# Patient Record
Sex: Female | Born: 1970 | Race: Black or African American | Hispanic: No | Marital: Single | Smoking: Former smoker
Health system: Southern US, Community
[De-identification: ages and names within clinical notes are randomized; demographics above are authoritative.]

## PROBLEM LIST (undated history)

## (undated) ENCOUNTER — Emergency Department (HOSPITAL_COMMUNITY): Admission: EM | Payer: No Typology Code available for payment source | Source: Home / Self Care

## (undated) DIAGNOSIS — F419 Anxiety disorder, unspecified: Secondary | ICD-10-CM

## (undated) DIAGNOSIS — I10 Essential (primary) hypertension: Secondary | ICD-10-CM

## (undated) DIAGNOSIS — E119 Type 2 diabetes mellitus without complications: Secondary | ICD-10-CM

## (undated) DIAGNOSIS — K219 Gastro-esophageal reflux disease without esophagitis: Secondary | ICD-10-CM

## (undated) DIAGNOSIS — J45909 Unspecified asthma, uncomplicated: Secondary | ICD-10-CM

## (undated) DIAGNOSIS — R51 Headache: Principal | ICD-10-CM

## (undated) HISTORY — DX: Gastro-esophageal reflux disease without esophagitis: K21.9

## (undated) HISTORY — DX: Unspecified asthma, uncomplicated: J45.909

## (undated) HISTORY — PX: JOINT REPLACEMENT: SHX530

## (undated) HISTORY — DX: Headache: R51

---

## 1997-04-21 ENCOUNTER — Inpatient Hospital Stay (HOSPITAL_COMMUNITY): Admission: AD | Admit: 1997-04-21 | Discharge: 1997-04-21 | Payer: Self-pay | Admitting: Obstetrics

## 1997-05-04 ENCOUNTER — Encounter: Admission: RE | Admit: 1997-05-04 | Discharge: 1997-08-02 | Payer: Self-pay | Admitting: Obstetrics

## 1997-06-29 ENCOUNTER — Ambulatory Visit (HOSPITAL_COMMUNITY): Admission: RE | Admit: 1997-06-29 | Discharge: 1997-06-29 | Payer: Self-pay | Admitting: Obstetrics & Gynecology

## 1997-08-03 ENCOUNTER — Encounter: Admission: RE | Admit: 1997-08-03 | Discharge: 1997-11-01 | Payer: Self-pay | Admitting: Obstetrics

## 1997-08-31 ENCOUNTER — Inpatient Hospital Stay (HOSPITAL_COMMUNITY): Admission: AD | Admit: 1997-08-31 | Discharge: 1997-08-31 | Payer: Self-pay | Admitting: *Deleted

## 1997-09-19 ENCOUNTER — Inpatient Hospital Stay (HOSPITAL_COMMUNITY): Admission: AD | Admit: 1997-09-19 | Discharge: 1997-09-19 | Payer: Self-pay | Admitting: *Deleted

## 1997-09-20 ENCOUNTER — Inpatient Hospital Stay (HOSPITAL_COMMUNITY): Admission: RE | Admit: 1997-09-20 | Discharge: 1997-09-20 | Payer: Self-pay | Admitting: *Deleted

## 1997-09-27 ENCOUNTER — Ambulatory Visit (HOSPITAL_COMMUNITY): Admission: RE | Admit: 1997-09-27 | Discharge: 1997-09-27 | Payer: Self-pay | Admitting: Obstetrics

## 1997-10-19 ENCOUNTER — Inpatient Hospital Stay (HOSPITAL_COMMUNITY): Admission: RE | Admit: 1997-10-19 | Discharge: 1997-10-19 | Payer: Self-pay | Admitting: *Deleted

## 1997-10-26 ENCOUNTER — Encounter: Admission: RE | Admit: 1997-10-26 | Discharge: 1997-10-26 | Payer: Self-pay | Admitting: Obstetrics

## 1997-10-26 ENCOUNTER — Inpatient Hospital Stay (HOSPITAL_COMMUNITY): Admission: AD | Admit: 1997-10-26 | Discharge: 1997-10-26 | Payer: Self-pay | Admitting: *Deleted

## 1997-11-02 ENCOUNTER — Encounter: Admission: RE | Admit: 1997-11-02 | Discharge: 1997-11-02 | Payer: Self-pay | Admitting: Obstetrics

## 1997-11-03 ENCOUNTER — Ambulatory Visit (HOSPITAL_COMMUNITY): Admission: AD | Admit: 1997-11-03 | Discharge: 1997-11-03 | Payer: Self-pay | Admitting: Obstetrics

## 1997-11-09 ENCOUNTER — Encounter: Admission: RE | Admit: 1997-11-09 | Discharge: 1997-11-09 | Payer: Self-pay | Admitting: Obstetrics

## 1997-11-16 ENCOUNTER — Encounter: Admission: RE | Admit: 1997-11-16 | Discharge: 1997-11-16 | Payer: Self-pay | Admitting: Obstetrics

## 1997-11-17 ENCOUNTER — Ambulatory Visit (HOSPITAL_COMMUNITY): Admission: RE | Admit: 1997-11-17 | Discharge: 1997-11-17 | Payer: Self-pay | Admitting: *Deleted

## 1997-11-23 ENCOUNTER — Encounter: Admission: RE | Admit: 1997-11-23 | Discharge: 1997-11-23 | Payer: Self-pay | Admitting: Obstetrics

## 1997-11-27 ENCOUNTER — Encounter (HOSPITAL_COMMUNITY): Admission: RE | Admit: 1997-11-27 | Discharge: 1997-12-01 | Payer: Self-pay | Admitting: *Deleted

## 1997-11-29 ENCOUNTER — Encounter: Admission: RE | Admit: 1997-11-29 | Discharge: 1997-11-29 | Payer: Self-pay | Admitting: Obstetrics & Gynecology

## 1997-11-29 ENCOUNTER — Inpatient Hospital Stay (HOSPITAL_COMMUNITY): Admission: AD | Admit: 1997-11-29 | Discharge: 1997-12-03 | Payer: Self-pay | Admitting: Obstetrics

## 1997-12-08 ENCOUNTER — Inpatient Hospital Stay (HOSPITAL_COMMUNITY): Admission: AD | Admit: 1997-12-08 | Discharge: 1997-12-08 | Payer: Self-pay | Admitting: Obstetrics

## 1997-12-11 ENCOUNTER — Inpatient Hospital Stay (HOSPITAL_COMMUNITY): Admission: AD | Admit: 1997-12-11 | Discharge: 1997-12-11 | Payer: Self-pay | Admitting: Obstetrics & Gynecology

## 1999-08-25 ENCOUNTER — Emergency Department (HOSPITAL_COMMUNITY): Admission: EM | Admit: 1999-08-25 | Discharge: 1999-08-25 | Payer: Self-pay | Admitting: Emergency Medicine

## 1999-08-25 ENCOUNTER — Encounter: Payer: Self-pay | Admitting: Emergency Medicine

## 1999-10-01 ENCOUNTER — Encounter: Admission: RE | Admit: 1999-10-01 | Discharge: 1999-10-24 | Payer: Self-pay | Admitting: Orthopedic Surgery

## 1999-10-24 ENCOUNTER — Encounter: Admission: RE | Admit: 1999-10-24 | Discharge: 1999-12-19 | Payer: Self-pay | Admitting: Internal Medicine

## 1999-12-19 ENCOUNTER — Encounter: Admission: RE | Admit: 1999-12-19 | Discharge: 2000-03-18 | Payer: Self-pay | Admitting: Sports Medicine

## 2000-01-25 ENCOUNTER — Encounter: Payer: Self-pay | Admitting: Sports Medicine

## 2000-01-25 ENCOUNTER — Ambulatory Visit (HOSPITAL_COMMUNITY): Admission: RE | Admit: 2000-01-25 | Discharge: 2000-01-25 | Payer: Self-pay | Admitting: Sports Medicine

## 2000-04-14 ENCOUNTER — Encounter: Admission: RE | Admit: 2000-04-14 | Discharge: 2000-05-14 | Payer: Self-pay | Admitting: Internal Medicine

## 2000-06-01 ENCOUNTER — Encounter: Admission: RE | Admit: 2000-06-01 | Discharge: 2000-07-06 | Payer: Self-pay | Admitting: Internal Medicine

## 2003-02-04 ENCOUNTER — Emergency Department (HOSPITAL_COMMUNITY): Admission: EM | Admit: 2003-02-04 | Discharge: 2003-02-04 | Payer: Self-pay | Admitting: Emergency Medicine

## 2003-12-02 ENCOUNTER — Emergency Department (HOSPITAL_COMMUNITY): Admission: EM | Admit: 2003-12-02 | Discharge: 2003-12-03 | Payer: Self-pay | Admitting: Emergency Medicine

## 2004-11-05 ENCOUNTER — Ambulatory Visit: Payer: Self-pay | Admitting: Family Medicine

## 2004-11-26 ENCOUNTER — Ambulatory Visit: Payer: Self-pay | Admitting: Family Medicine

## 2004-12-09 ENCOUNTER — Ambulatory Visit: Payer: Self-pay | Admitting: *Deleted

## 2005-01-17 ENCOUNTER — Ambulatory Visit: Payer: Self-pay | Admitting: Family Medicine

## 2005-03-19 ENCOUNTER — Ambulatory Visit: Payer: Self-pay | Admitting: Family Medicine

## 2005-03-24 ENCOUNTER — Ambulatory Visit: Payer: Self-pay | Admitting: Family Medicine

## 2005-03-27 ENCOUNTER — Ambulatory Visit: Payer: Self-pay | Admitting: Family Medicine

## 2005-04-17 ENCOUNTER — Ambulatory Visit: Payer: Self-pay | Admitting: Family Medicine

## 2005-07-15 ENCOUNTER — Ambulatory Visit: Payer: Self-pay | Admitting: Family Medicine

## 2005-08-25 ENCOUNTER — Ambulatory Visit: Payer: Self-pay | Admitting: Family Medicine

## 2005-09-18 ENCOUNTER — Ambulatory Visit: Payer: Self-pay | Admitting: Family Medicine

## 2005-11-05 ENCOUNTER — Ambulatory Visit: Payer: Self-pay | Admitting: Family Medicine

## 2006-10-07 ENCOUNTER — Encounter (INDEPENDENT_AMBULATORY_CARE_PROVIDER_SITE_OTHER): Payer: Self-pay | Admitting: *Deleted

## 2006-12-08 ENCOUNTER — Emergency Department (HOSPITAL_COMMUNITY): Admission: EM | Admit: 2006-12-08 | Discharge: 2006-12-08 | Payer: Self-pay | Admitting: Emergency Medicine

## 2007-02-16 ENCOUNTER — Ambulatory Visit (HOSPITAL_COMMUNITY): Admission: RE | Admit: 2007-02-16 | Discharge: 2007-02-16 | Payer: Self-pay | Admitting: Chiropractic Medicine

## 2008-01-27 ENCOUNTER — Emergency Department (HOSPITAL_COMMUNITY): Admission: EM | Admit: 2008-01-27 | Discharge: 2008-01-27 | Payer: Self-pay | Admitting: Emergency Medicine

## 2008-02-10 ENCOUNTER — Inpatient Hospital Stay (HOSPITAL_COMMUNITY): Admission: AD | Admit: 2008-02-10 | Discharge: 2008-02-10 | Payer: Self-pay | Admitting: Obstetrics & Gynecology

## 2008-07-19 ENCOUNTER — Encounter: Admission: RE | Admit: 2008-07-19 | Discharge: 2008-07-19 | Payer: Self-pay | Admitting: Obstetrics and Gynecology

## 2008-07-27 ENCOUNTER — Emergency Department (HOSPITAL_COMMUNITY): Admission: EM | Admit: 2008-07-27 | Discharge: 2008-07-27 | Payer: Self-pay | Admitting: Family Medicine

## 2008-08-04 ENCOUNTER — Ambulatory Visit: Admission: RE | Admit: 2008-08-04 | Discharge: 2008-08-04 | Payer: Self-pay | Admitting: Obstetrics and Gynecology

## 2008-08-16 ENCOUNTER — Emergency Department (HOSPITAL_COMMUNITY): Admission: EM | Admit: 2008-08-16 | Discharge: 2008-08-16 | Payer: Self-pay | Admitting: Family Medicine

## 2008-08-28 ENCOUNTER — Emergency Department (HOSPITAL_COMMUNITY): Admission: EM | Admit: 2008-08-28 | Discharge: 2008-08-28 | Payer: Self-pay | Admitting: Emergency Medicine

## 2008-09-19 ENCOUNTER — Inpatient Hospital Stay (HOSPITAL_COMMUNITY): Admission: RE | Admit: 2008-09-19 | Discharge: 2008-09-22 | Payer: Self-pay | Admitting: Obstetrics and Gynecology

## 2008-11-16 ENCOUNTER — Emergency Department (HOSPITAL_COMMUNITY): Admission: EM | Admit: 2008-11-16 | Discharge: 2008-11-16 | Payer: Self-pay | Admitting: Emergency Medicine

## 2008-11-27 ENCOUNTER — Ambulatory Visit (HOSPITAL_COMMUNITY): Admission: RE | Admit: 2008-11-27 | Discharge: 2008-11-28 | Payer: Self-pay | Admitting: Orthopedic Surgery

## 2008-12-28 ENCOUNTER — Encounter: Admission: RE | Admit: 2008-12-28 | Discharge: 2009-01-18 | Payer: Self-pay | Admitting: Orthopedic Surgery

## 2009-01-22 ENCOUNTER — Encounter: Admission: RE | Admit: 2009-01-22 | Discharge: 2009-04-02 | Payer: Self-pay | Admitting: Orthopedic Surgery

## 2009-08-02 ENCOUNTER — Ambulatory Visit (HOSPITAL_BASED_OUTPATIENT_CLINIC_OR_DEPARTMENT_OTHER): Admission: RE | Admit: 2009-08-02 | Discharge: 2009-08-03 | Payer: Self-pay | Admitting: Unknown Physician Specialty

## 2009-08-09 ENCOUNTER — Encounter: Admission: RE | Admit: 2009-08-09 | Discharge: 2009-10-25 | Payer: Self-pay | Admitting: Orthopedic Surgery

## 2009-09-18 ENCOUNTER — Emergency Department (HOSPITAL_COMMUNITY): Admission: EM | Admit: 2009-09-18 | Discharge: 2009-09-18 | Payer: Self-pay | Admitting: Emergency Medicine

## 2009-11-08 ENCOUNTER — Ambulatory Visit (HOSPITAL_BASED_OUTPATIENT_CLINIC_OR_DEPARTMENT_OTHER): Admission: RE | Admit: 2009-11-08 | Discharge: 2009-11-08 | Payer: Self-pay | Admitting: Unknown Physician Specialty

## 2010-04-03 LAB — POCT HEMOGLOBIN-HEMACUE: Hemoglobin: 13.5 g/dL (ref 12.0–15.0)

## 2010-04-24 LAB — BASIC METABOLIC PANEL
CO2: 25 mEq/L (ref 19–32)
Chloride: 103 mEq/L (ref 96–112)
Glucose, Bld: 95 mg/dL (ref 70–99)
Potassium: 3.6 mEq/L (ref 3.5–5.1)
Sodium: 138 mEq/L (ref 135–145)

## 2010-04-24 LAB — CBC
RBC: 4.74 MIL/uL (ref 3.87–5.11)
WBC: 10.4 10*3/uL (ref 4.0–10.5)

## 2010-04-25 LAB — BASIC METABOLIC PANEL
BUN: 8 mg/dL (ref 6–23)
CO2: 22 mEq/L (ref 19–32)
Creatinine, Ser: 0.9 mg/dL (ref 0.4–1.2)
GFR calc Af Amer: >60 mL/min (ref >60)
GFR calc non Af Amer: >60 mL/min (ref >60)

## 2010-04-25 LAB — CBC
HCT: 41.6 % (ref 36.0–46.0)
Platelets: 218 10*3/uL (ref 150–400)
RBC: 4.97 MIL/uL (ref 3.87–5.11)
RDW: 14 % (ref 11.5–15.5)
WBC: 10.3 10*3/uL (ref 4.0–10.5)

## 2010-04-25 LAB — DIFFERENTIAL
Basophils Absolute: 0 10*3/uL (ref 0.0–0.1)
Basophils Relative: 0 % (ref 0–1)
Eosinophils Absolute: 0.1 10*3/uL (ref 0.0–0.7)
Monocytes Absolute: 0.6 10*3/uL (ref 0.1–1.0)
Monocytes Relative: 6 % (ref 3–12)

## 2010-04-26 LAB — GLUCOSE, CAPILLARY
Glucose-Capillary: 102 mg/dL — ABNORMAL HIGH (ref 70–99)
Glucose-Capillary: 106 mg/dL — ABNORMAL HIGH (ref 70–99)
Glucose-Capillary: 111 mg/dL — ABNORMAL HIGH (ref 70–99)
Glucose-Capillary: 86 mg/dL (ref 70–99)
Glucose-Capillary: 96 mg/dL (ref 70–99)

## 2010-04-26 LAB — CCBB MATERNAL DONOR DRAW

## 2010-04-26 LAB — RH IMMUNE GLOB WKUP(>/=20WKS)(NOT WOMEN'S HOSP): Fetal Screen: NEGATIVE

## 2010-04-26 LAB — CBC
HCT: 39.4 % (ref 36.0–46.0)
MCV: 86.8 fL (ref 78.0–100.0)
RDW: 14 % (ref 11.5–15.5)
WBC: 11.9 10*3/uL — ABNORMAL HIGH (ref 4.0–10.5)

## 2010-04-27 LAB — CBC
HCT: 39.7 % (ref 36.0–46.0)
MCHC: 32.5 g/dL (ref 30.0–36.0)
MCV: 86.1 fL (ref 78.0–100.0)
Platelets: 196 10*3/uL (ref 150–400)
RDW: 14.4 % (ref 11.5–15.5)
WBC: 10.4 10*3/uL (ref 4.0–10.5)

## 2010-04-27 LAB — GLUCOSE, CAPILLARY
Glucose-Capillary: 108 mg/dL — ABNORMAL HIGH (ref 70–99)
Glucose-Capillary: 112 mg/dL — ABNORMAL HIGH (ref 70–99)
Glucose-Capillary: 120 mg/dL — ABNORMAL HIGH (ref 70–99)
Glucose-Capillary: 122 mg/dL — ABNORMAL HIGH (ref 70–99)
Glucose-Capillary: 131 mg/dL — ABNORMAL HIGH (ref 70–99)
Glucose-Capillary: 158 mg/dL — ABNORMAL HIGH (ref 70–99)
Glucose-Capillary: 92 mg/dL (ref 70–99)

## 2010-04-27 LAB — RPR: RPR Ser Ql: NONREACTIVE

## 2010-05-06 LAB — URINE CULTURE

## 2010-05-06 LAB — POCT I-STAT, CHEM 8
BUN: 7 mg/dL (ref 6–23)
Chloride: 103 mEq/L (ref 96–112)
Creatinine, Ser: 0.9 mg/dL (ref 0.4–1.2)
Potassium: 4.1 mEq/L (ref 3.5–5.1)
Sodium: 137 mEq/L (ref 135–145)
TCO2: 24 mmol/L (ref 0–100)

## 2010-05-06 LAB — WET PREP, GENITAL: Yeast Wet Prep HPF POC: NONE SEEN

## 2010-05-06 LAB — URINALYSIS, ROUTINE W REFLEX MICROSCOPIC
Bilirubin Urine: NEGATIVE
Hgb urine dipstick: NEGATIVE
Ketones, ur: NEGATIVE mg/dL
Specific Gravity, Urine: 1.015 (ref 1.005–1.030)
Urobilinogen, UA: 0.2 mg/dL (ref 0.0–1.0)

## 2010-05-06 LAB — CBC
HCT: 39.3 % (ref 36.0–46.0)
MCV: 87.1 fL (ref 78.0–100.0)
Platelets: 245 10*3/uL (ref 150–400)
RDW: 13 % (ref 11.5–15.5)

## 2010-05-06 LAB — POCT PREGNANCY, URINE: Preg Test, Ur: POSITIVE

## 2010-05-06 LAB — GC/CHLAMYDIA PROBE AMP, GENITAL
Chlamydia, DNA Probe: NEGATIVE
GC Probe Amp, Genital: NEGATIVE

## 2010-05-06 LAB — URINE MICROSCOPIC-ADD ON

## 2010-06-04 NOTE — Op Note (Signed)
[name], Taylor [name]                 ACCOUNT NO.:  [id number]   MEDICAL RECORD NO.:  [id number]          PATIENT TYPE:  INP   LOCATION:  9146                          FACILITY:  [facility]   PHYSICIAN:  Malva Limes, M.D.    DATE OF BIRTH:  20-Jan-1971   DATE OF PROCEDURE:  09/19/2008  DATE OF DISCHARGE:                               OPERATIVE REPORT   PREOPERATIVE DIAGNOSES:  1. Intrauterine pregnancy at term.  2. The patient desires primary cesarean section.  3. Insulin-dependent gestational diabetes.  4. Advanced maternal age.   POSTOPERATIVE DIAGNOSES:  1. Intrauterine pregnancy at term.  2. The patient desires primary cesarean section.  3. Insulin-dependent gestational diabetes.  4. Advanced maternal age.   PROCEDURE:  Primary low-transverse cesarean section.   SURGEON:  Malva Limes, MD   ANESTHESIA:  Spinal.   ANTIBIOTIC:  Ancef 1 g.   DRAINS:  Foley bedside drainage.   ESTIMATED BLOOD LOSS:  900 mL.   COMPLICATIONS:  None.   SPECIMENS:  None.   PROCEDURE:  The patient was taken to the operating room, where a spinal  anesthetic was placed without difficulty.  She was then placed in the  dorsal supine position with the left lateral tilt.  She was prepped with  Betadine and draped in the usual fashion for this procedure.  A  Pfannenstiel incision was made 2 cm above the pubic symphysis.  On  entering the abdominal cavity, the bladder flap was taken down with  sharp dissection.  A low transverse uterine incision was made in the  midline and extended laterally with blunt dissection.  Amniotic fluid  was noted to be clear.  The vacuum extractor was used to deliver the  vertex.  On delivery of the head, the oropharynx and nostrils were bulb  suctioned.  A nuchal cord x1 was reduced.  The remaining infant was then  delivered.  The cord was doubly clamped and cut and the infant was  handed to the awaiting NICU team.  Placenta was manually removed.  The  uterus was  exteriorized.  The uterine cavity was examined and wiped  clean.  The uterine incision was closed in a single layer of O Monocryl  in a running locking fashion.  The bladder flap was closed using 2-0  Monocryl suture in a running fashion.  The uterus was placed back in the  abdominal cavity.  Ovaries and fallopian tubes were normal bilaterally.  The parietal peritoneum and rectus muscles reapproximated in the midline  using 2-0  Monocryl in a running fashion.  The fascia was closed using 0 Monocryl  suture in running fashion.  Subcuticular tissue was made hemostatic with  Bovie.  Stainless steel clips used to close the skin.  The patient  tolerated the procedure well.  She was taken to the recovery room in  stable condition.  Instrument and lap counts were correct x2.           ______________________________  Malva Limes, M.D.     MA/MEDQ  D:  09/19/2008  T:  09/20/2008  Job:  [id number]

## 2011-03-05 ENCOUNTER — Other Ambulatory Visit: Payer: Self-pay | Admitting: Internal Medicine

## 2011-03-05 ENCOUNTER — Ambulatory Visit
Admission: RE | Admit: 2011-03-05 | Discharge: 2011-03-05 | Disposition: A | Discharge status: Home / Self Care | Payer: Medicaid Other | Source: Ambulatory Visit | Attending: Internal Medicine | Admitting: Internal Medicine

## 2011-03-05 DIAGNOSIS — M545 Low back pain: Secondary | ICD-10-CM

## 2011-03-14 ENCOUNTER — Emergency Department (HOSPITAL_COMMUNITY)
Admission: EM | Admit: 2011-03-14 | Discharge: 2011-03-15 | Disposition: A | Discharge status: Home / Self Care | Payer: Medicaid Other | Attending: Emergency Medicine | Admitting: Emergency Medicine

## 2011-03-14 ENCOUNTER — Other Ambulatory Visit: Payer: Self-pay

## 2011-03-14 ENCOUNTER — Encounter (HOSPITAL_COMMUNITY): Payer: Self-pay | Admitting: *Deleted

## 2011-03-14 DIAGNOSIS — I10 Essential (primary) hypertension: Secondary | ICD-10-CM | POA: Insufficient documentation

## 2011-03-14 DIAGNOSIS — M549 Dorsalgia, unspecified: Secondary | ICD-10-CM | POA: Insufficient documentation

## 2011-03-14 DIAGNOSIS — R079 Chest pain, unspecified: Secondary | ICD-10-CM | POA: Insufficient documentation

## 2011-03-14 DIAGNOSIS — R0602 Shortness of breath: Secondary | ICD-10-CM | POA: Insufficient documentation

## 2011-03-14 DIAGNOSIS — F411 Generalized anxiety disorder: Secondary | ICD-10-CM | POA: Insufficient documentation

## 2011-03-14 HISTORY — DX: Essential (primary) hypertension: I10

## 2011-03-14 HISTORY — DX: Anxiety disorder, unspecified: F41.9

## 2011-03-14 NOTE — ED Notes (Signed)
Pt was doing nothing of note x 3 weeks ago when she noticed a sharp pain in her chest that has been intermittent since that point and usually occurs with deep inspiration.

## 2011-03-14 NOTE — ED Notes (Signed)
EKG given to EDP, Belfi. 

## 2011-03-15 ENCOUNTER — Emergency Department (HOSPITAL_COMMUNITY): Payer: Medicaid Other

## 2011-03-15 LAB — CBC
HCT: 41.2 % (ref 36.0–46.0)
MCHC: 33 g/dL (ref 30.0–36.0)
MCV: 83.9 fL (ref 78.0–100.0)
RDW: 13.4 % (ref 11.5–15.5)

## 2011-03-15 LAB — DIFFERENTIAL
Basophils Absolute: 0 10*3/uL (ref 0.0–0.1)
Basophils Relative: 0 % (ref 0–1)
Eosinophils Absolute: 0.1 10*3/uL (ref 0.0–0.7)
Eosinophils Relative: 1 % (ref 0–5)
Monocytes Absolute: 0.6 10*3/uL (ref 0.1–1.0)

## 2011-03-15 LAB — BASIC METABOLIC PANEL
Calcium: 9.3 mg/dL (ref 8.4–10.5)
Creatinine, Ser: 0.72 mg/dL (ref 0.50–1.10)
GFR calc Af Amer: >90 mL/min (ref >90)

## 2011-03-15 LAB — TROPONIN I: Troponin I: <0.3 ng/mL (ref <0.30)

## 2011-03-15 MED ORDER — ASPIRIN 81 MG PO CHEW
324.0000 mg | CHEWABLE_TABLET | Freq: Once | ORAL | Status: AC
  Administered 2011-03-15: 324 mg via ORAL
  Filled 2011-03-15: qty 4

## 2011-03-15 NOTE — ED Provider Notes (Signed)
Medical screening examination/treatment/procedure(s) were performed by non-physician practitioner and as supervising physician I was immediately available for consultation/collaboration.   Lorissa Kishbaugh, MD 03/15/11 0329 

## 2011-03-15 NOTE — ED Provider Notes (Signed)
History     CSN: [id number]  Arrival date & time 03/14/11  2335   First MD Initiated Contact with Patient 03/15/11 0046      Chief Complaint  Patient presents with  . Chest Pain  . Back Pain     HPI  History provided by the patient. Patient is a 40 year old American female with history of hypertension and anxiety who presents with complaints of pleuritic left chest pain with occasional shortness of breath for the past 3 weeks. Patient states symptoms came on acutely and have been persistent since that time. Symptoms do wax and wane occasionally. Symptoms are described as moderate. They are not associated with any specific activities or exertion. Patient does not report any aggravating or alleviating factors. Patient reports being seen by PCP for these symptoms. She was told they persisted to come to emergency room for further evaluation. Patient denies any fever, chills, sweats, cough, hemoptysis, nausea vomiting. Patient denies any recent travel, recent surgery, history of cancer, history of DVT or PE, no clotting disorder.   Past Medical History  Diagnosis Date  . Hypertension   . Anxiety     Past Surgical History  Procedure Date  . Joint replacement     right elbow replacement in 2010 that was "removed in 2011"    No family history on file.  History  Substance Use Topics  . Smoking status: Not on file  . Smokeless tobacco: Not on file  . Alcohol Use:     OB History    Grav Para Term Preterm Abortions TAB SAB Ect Mult Living                  Review of Systems  Constitutional: Negative for fever, chills and diaphoresis.  Respiratory: Positive for shortness of breath. Negative for cough.   Cardiovascular: Positive for chest pain.  Gastrointestinal: Negative for nausea, vomiting, abdominal pain, diarrhea and constipation.  All other systems reviewed and are negative.    Allergies  Review of patient's allergies indicates no known allergies.  Home Medications     Current Outpatient Rx  Name Route Sig Dispense Refill  . ALPRAZOLAM 0.5 MG PO TABS Oral Take 0.5 mg by mouth 3 (three) times daily as needed.    . ATENOLOL 100 MG PO TABS Oral Take 100 mg by mouth daily.    Marland Kitchen CETIRIZINE HCL 10 MG PO TABS Oral Take 10 mg by mouth daily.    . CYCLOBENZAPRINE HCL 10 MG PO TABS Oral Take 10 mg by mouth 2 (two) times daily as needed. Pain    . IBUPROFEN 800 MG PO TABS Oral Take 800 mg by mouth every 8 (eight) hours as needed. Pain    . LISINOPRIL 30 MG PO TABS Oral Take 30 mg by mouth daily.    Marland Kitchen OMEPRAZOLE 20 MG PO CPDR Oral Take 20 mg by mouth daily.    Marland Kitchen PROMETHAZINE HCL 25 MG PO TABS Oral Take 25 mg by mouth every 6 (six) hours as needed. Nausea    . TRAMADOL HCL 50 MG PO TABS Oral Take 50 mg by mouth every 6 (six) hours as needed. Pain    . ZOLPIDEM TARTRATE 5 MG PO TABS Oral Take 5 mg by mouth at bedtime as needed. Insomnia      BP 124/69  Pulse 87  Temp(Src) 98.1 F (36.7 C) (Oral)  Resp 19  SpO2 100%  LMP 02/28/2011  Physical Exam  Nursing note and vitals reviewed. Constitutional: She is  oriented to person, place, and time. She appears well-developed and well-nourished. No distress.  HENT:  Head: Normocephalic and atraumatic.  Mouth/Throat: Oropharynx is clear and moist.  Neck: Normal range of motion. Neck supple. No JVD present.  Cardiovascular: Normal rate and regular rhythm.   Pulmonary/Chest: Effort normal and breath sounds normal. No respiratory distress. She has no wheezes. She has no rales. She exhibits no tenderness.  Abdominal: Soft. She exhibits no distension. There is no tenderness. There is no rebound and no guarding.  Musculoskeletal: She exhibits no edema and no tenderness.  Neurological: She is alert and oriented to person, place, and time.  Skin: Skin is warm and dry. No rash noted.  Psychiatric: She has a normal mood and affect. Her behavior is normal.    ED Course  Procedures   Results for orders placed during the  hospital encounter of 03/14/11  CBC      Component Value Range   WBC 9.7  4.0 - 10.5 (K/uL)   RBC 4.91  3.87 - 5.11 (MIL/uL)   Hemoglobin 13.6  12.0 - 15.0 (g/dL)   HCT 16.1  09.6 - 04.5 (%)   MCV 83.9  78.0 - 100.0 (fL)   MCH 27.7  26.0 - 34.0 (pg)   MCHC 33.0  30.0 - 36.0 (g/dL)   RDW 40.9  81.1 - 91.4 (%)   Platelets 257  150 - 400 (K/uL)  DIFFERENTIAL      Component Value Range   Neutrophils Relative 64  43 - 77 (%)   Neutro Abs 6.3  1.7 - 7.7 (K/uL)   Lymphocytes Relative 29  12 - 46 (%)   Lymphs Abs 2.8  0.7 - 4.0 (K/uL)   Monocytes Relative 6  3 - 12 (%)   Monocytes Absolute 0.6  0.1 - 1.0 (K/uL)   Eosinophils Relative 1  0 - 5 (%)   Eosinophils Absolute 0.1  0.0 - 0.7 (K/uL)   Basophils Relative 0  0 - 1 (%)   Basophils Absolute 0.0  0.0 - 0.1 (K/uL)  BASIC METABOLIC PANEL      Component Value Range   Sodium 132 (*) 135 - 145 (mEq/L)   Potassium 4.5  3.5 - 5.1 (mEq/L)   Chloride 100  96 - 112 (mEq/L)   CO2 22  19 - 32 (mEq/L)   Glucose, Bld 207 (*) 70 - 99 (mg/dL)   BUN 6  6 - 23 (mg/dL)   Creatinine, Ser 7.82  0.50 - 1.10 (mg/dL)   Calcium 9.3  8.4 - 95.6 (mg/dL)   GFR calc non Af Amer >90  >90 (mL/min)   GFR calc Af Amer >90  >90 (mL/min)  TROPONIN I      Component Value Range   Troponin I <0.30  <0.30 (ng/mL)  D-DIMER, QUANTITATIVE      Component Value Range   D-Dimer, Quant 0.45  0.00 - 0.48 (ug/mL-FEU)      Dg Chest 2 View  03/15/2011  *RADIOLOGY REPORT*  Clinical Data: Pain, shortness of breath.  CHEST - 2 VIEW  Comparison: 11/23/2008  Findings: Heart and mediastinal contours are within normal limits. No focal opacities or effusions.  No acute bony abnormality.  IMPRESSION: No active cardiopulmonary disease.  Original Report Authenticated By: Cyndie Chime, M.D.     1. Chest pain       MDM  12:45 AM patient seen and evaluated. Patient in no acute distress. Patient with normal respirations, heart rate and O2 saturation. Patient is  on oral  contraception but has no other significant risk factors for PE or DVT. No clinical signs for DVT. No recent travel no history of cancer, no hemoptysis no recent surgery.  Patient seen and discussed with attending physician. Labs as, EKG and chest x-ray unremarkable. Patient with symptoms atypical for ACS. Patient with no significant symptoms or exam findings concerning for PE. D-dimer is negative. At this time we'll discharge home. Patient will followup with PCP.   Date: 03/14/2011  Rate: 99  Rhythm: normal sinus rhythm  QRS Axis: normal  Intervals: normal  ST/T Wave abnormalities: normal  Conduction Disutrbances:none  Narrative Interpretation:   Old EKG Reviewed: none available      Angus Seller, [place] 03/15/11 [phone]

## 2011-03-15 NOTE — ED Notes (Signed)
Nasal cannula with O2 at 2 L applied to pt.  SpO2 100%.

## 2011-03-15 NOTE — Discharge Instructions (Signed)
You were seen and evaluated today for your symptoms of chest pain. This time your lab tests, EKG and chest x-ray have not shown any concerning signs for your symptoms. Your providers today he'll you are able to return home and follow up your primary care provider. He may use Tylenol and ibuprofen for your symptoms of discomfort. If you develop any worsening symptoms, persistent pain increase shortness of breath, fever, chills please return to emergency room.  Chest Pain (Nonspecific) It is often hard to give a specific diagnosis for the cause of chest pain. There is always a chance that your pain could be related to something serious, such as a heart attack or a blood clot in the lungs. You need to follow up with your caregiver for further evaluation. CAUSES   Heartburn.   Pneumonia or bronchitis.   Anxiety and stress.   Inflammation around your heart (pericarditis) or lung (pleuritis or pleurisy).   A blood clot in the lung.   A collapsed lung (pneumothorax). It can develop suddenly on its own (spontaneous pneumothorax) or from injury (trauma) to the chest.  The chest wall is composed of bones, muscles, and cartilage. Any of these can be the source of the pain.  The bones can be bruised by injury.   The muscles or cartilage can be strained by coughing or overwork.   The cartilage can be affected by inflammation and become sore (costochondritis).  DIAGNOSIS  Lab tests or other studies, such as X-rays, an EKG, stress testing, or cardiac imaging, may be needed to find the cause of your pain.  TREATMENT   Treatment depends on what may be causing your chest pain. Treatment may include:   Acid blockers for heartburn.   Anti-inflammatory medicine.   Pain medicine for inflammatory conditions.   Antibiotics if an infection is present.   You may be advised to change lifestyle habits. This includes stopping smoking and avoiding caffeine and chocolate.   You may be advised to keep your  head raised (elevated) when sleeping. This reduces the chance of acid going backward from your stomach into your esophagus.   Most of the time, nonspecific chest pain will improve within 2 to 3 days with rest and mild pain medicine.  HOME CARE INSTRUCTIONS   If antibiotics were prescribed, take the full amount even if you start to feel better.   For the next few days, avoid physical activities that bring on chest pain. Continue physical activities as directed.   Do not smoke cigarettes or drink alcohol until your symptoms are gone.   Only take over-the-counter or prescription medicine for pain, discomfort, or fever as directed by your caregiver.   Follow your caregiver's suggestions for further testing if your chest pain does not go away.   Keep any follow-up appointments you made. If you do not go to an appointment, you could develop lasting (chronic) problems with pain. If there is any problem keeping an appointment, you must call to reschedule.  SEEK MEDICAL CARE IF:   You think you are having problems from the medicine you are taking. Read your medicine instructions carefully.   Your chest pain does not go away, even after treatment.   You develop a rash with blisters on your chest.  SEEK IMMEDIATE MEDICAL CARE IF:   You have increased chest pain or pain that spreads to your arm, neck, jaw, back, or belly (abdomen).   You develop shortness of breath, an increasing cough, or you are coughing up blood.  You have severe back or abdominal pain, feel sick to your stomach (nauseous) or throw up (vomit).   You develop severe weakness, fainting, or chills.   You have an oral temperature above 102 F (38.9 C), not controlled by medicine.  THIS IS AN EMERGENCY. Do not wait to see if the pain will go away. Get medical help at once. Call your local emergency services (911 in [place] Do not drive yourself to the hospital. MAKE SURE YOU:   Understand these instructions.   Will watch  your condition.   Will get help right away if you are not doing well or get worse.  Document Released: 10/16/2004 Document Revised: 09/18/2010 Document Reviewed: 08/12/2007 [facility] Patient Information 2012 [place], [place].

## 2011-03-15 NOTE — ED Notes (Signed)
Pt is alert and oriented x 4 with respirations even and unlabored.  NAD at this time.  Discharge instructions reviewed with pt and pt verbalized understanding.  Pt ambulated to lobby with steady gait. 

## 2011-08-01 ENCOUNTER — Institutional Professional Consult (permissible substitution): Payer: Medicaid Other | Admitting: Internal Medicine

## 2011-09-11 ENCOUNTER — Institutional Professional Consult (permissible substitution): Payer: Medicaid Other | Admitting: Internal Medicine

## 2011-09-30 ENCOUNTER — Encounter: Payer: Medicaid Other | Attending: Internal Medicine | Admitting: *Deleted

## 2011-09-30 DIAGNOSIS — E119 Type 2 diabetes mellitus without complications: Secondary | ICD-10-CM | POA: Insufficient documentation

## 2011-09-30 DIAGNOSIS — Z713 Dietary counseling and surveillance: Secondary | ICD-10-CM | POA: Insufficient documentation

## 2011-10-01 ENCOUNTER — Encounter: Payer: Self-pay | Admitting: *Deleted

## 2011-10-01 NOTE — Progress Notes (Signed)
Current A1c is 9.0% on September 15, 2011

## 2011-10-01 NOTE — Progress Notes (Signed)
  Patient was seen on 09/30/2011 for the first of a series of three diabetes self-management courses at the [facility]. The following learning objectives were met by the patient during this course:   Defines the role of glucose and insulin  Identifies type of diabetes and pathophysiology  Defines the diagnostic criteria for diabetes and prediabetes  States the risk factors for Type 2 Diabetes  States the symptoms of Type 2 Diabetes  Defines Type 2 Diabetes treatment goals  Defines Type 2 Diabetes treatment options  States the rationale for glucose monitoring  Identifies A1C, glucose targets, and testing times  Identifies proper sharps disposal  Defines the purpose of a diabetes food plan  Identifies carbohydrate food groups  Defines effects of carbohydrate foods on glucose levels  Identifies carbohydrate choices/grams/food labels  States benefits of physical activity and effect on glucose  Review of suggested activity guidelines  Handouts given during class include:  Type 2 Diabetes: Basics Book  My Food Plan Book  Food and Activity Log  Follow-Up Plan: Core Class 2

## 2011-10-01 NOTE — Patient Instructions (Signed)
Goals:  Follow Diabetes Meal Plan as instructed  Eat 3 meals and 2 snacks, every 3-5 hrs  Limit carbohydrate intake to 30-45 grams carbohydrate/meal  Limit carbohydrate intake to 0 grams carbohydrate/snack  Add lean protein foods to meals/snacks  Monitor glucose levels as instructed by your doctor  Aim for 0 mins of physical activity daily due to extreme pain at this time  Bring food record and glucose log to your next nutrition visit

## 2011-10-27 ENCOUNTER — Encounter: Payer: Self-pay | Admitting: Internal Medicine

## 2011-10-27 ENCOUNTER — Ambulatory Visit (INDEPENDENT_AMBULATORY_CARE_PROVIDER_SITE_OTHER): Payer: Medicaid Other | Admitting: Internal Medicine

## 2011-10-27 ENCOUNTER — Ambulatory Visit (INDEPENDENT_AMBULATORY_CARE_PROVIDER_SITE_OTHER)
Admission: RE | Admit: 2011-10-27 | Discharge: 2011-10-27 | Disposition: A | Discharge status: Home / Self Care | Payer: Medicaid Other | Source: Ambulatory Visit | Attending: Internal Medicine | Admitting: Internal Medicine

## 2011-10-27 VITALS — BP 128/80 | HR 104 | Ht 65.0 in | Wt 205.2 lb

## 2011-10-27 DIAGNOSIS — R0989 Other specified symptoms and signs involving the circulatory and respiratory systems: Secondary | ICD-10-CM

## 2011-10-27 DIAGNOSIS — R0600 Dyspnea, unspecified: Secondary | ICD-10-CM

## 2011-10-27 DIAGNOSIS — R0609 Other forms of dyspnea: Secondary | ICD-10-CM

## 2011-10-27 NOTE — Progress Notes (Signed)
10/27/11- 41 yoF former smoker referred courtesy of Dr. Lovell Sheehan because of dyspnea. She describes episode of shortness of breath "like about to pass out", associated with substernal pressure pain and heart pounding. This has been happening 2 or 3 times a month over the past 7 years. Triggers include heat and exertion. Relieved by sitting down and turning off and. Episodes are happening 2 or 3 times a month with no pattern. They can last an hour at a time, but usually 15 or 20 minutes. There may be wheeze and cough with scant phlegm. Hands tingle. She describes dyspnea on exertion from trying to keep up with her 41-year-old child. Emergency room visit 03/05/2011 nothing wrong by her report. She admits a history of anxiety attacks. She has a history of GERD treated with Prevacid. There is no history of asthma or pneumonia. Medical problems include hypertension, type 2 diabetes, allergic rhinitis chronic headache. She takes Xanax about twice a day for anxiety. Rescue inhaler helps the shortness of breath.  CXR 03/15/11-reviewed with her IMPRESSION:  No active cardiopulmonary disease.  Original Report Authenticated By: Cyndie Chime, M.D.   Prior to Admission medications   Medication Sig Start Date End Date Taking? Authorizing Provider  ALPRAZolam Prudy Feeler) 0.5 MG tablet Take 0.5 mg by mouth 3 (three) times daily as needed.   Yes Historical Provider, MD  atenolol (TENORMIN) 100 MG tablet Take 100 mg by mouth daily.   Yes Historical Provider, MD  cetirizine (ZYRTEC) 10 MG tablet Take 10 mg by mouth daily.   Yes Historical Provider, MD  cyclobenzaprine (FLEXERIL) 10 MG tablet Take 10 mg by mouth 2 (two) times daily as needed. Pain   Yes Historical Provider, MD  glimepiride (AMARYL) 4 MG tablet Take 4 mg by mouth daily before breakfast.   Yes Historical Provider, MD  ibuprofen (ADVIL,MOTRIN) 800 MG tablet Take 800 mg by mouth every 8 (eight) hours as needed. Pain   Yes Historical Provider, MD  lisinopril  (PRINIVIL,ZESTRIL) 30 MG tablet Take 30 mg by mouth daily.   Yes Historical Provider, MD  omeprazole (PRILOSEC) 20 MG capsule Take 20 mg by mouth daily.   Yes Historical Provider, MD  promethazine (PHENERGAN) 25 MG tablet Take 25 mg by mouth every 6 (six) hours as needed. Nausea   Yes Historical Provider, MD  traMADol (ULTRAM) 50 MG tablet Take 50 mg by mouth every 6 (six) hours as needed. Pain   Yes Historical Provider, MD  zolpidem (AMBIEN) 5 MG tablet Take 5 mg by mouth at bedtime as needed. Insomnia   Yes Historical Provider, MD   Past Medical History  Diagnosis Date  . Hypertension   . Anxiety   . Diabetes mellitus    Past Surgical History  Procedure Date  . Joint replacement     right elbow replacement in 2010 that was "removed in 2011"   History reviewed. No pertinent family history. History   Social History  . Marital Status: Single    Spouse Name: N/A    Number of Children: 2  . Years of Education: N/A   Occupational History  . [employer]    Social History Main Topics  . Smoking status: Former Smoker -- 0.5 packs/day for 10 years    Types: Cigarettes    Quit date: 01/21/2007  . Smokeless tobacco: Not on file  . Alcohol Use: Yes     occasional  . Drug Use: No  . Sexually Active: Yes    Birth Control/ Protection: Pill  Other Topics Concern  . Not on file   Social History Narrative  . No narrative on file   ROS-see HPI Constitutional:   No-   weight loss, night sweats, fevers, chills, fatigue, lassitude. HEENT:   No-  headaches, difficulty swallowing, tooth/dental problems, sore throat,       No-  sneezing, itching, ear ache, nasal congestion, post nasal drip,  CV:  No-   +chest pain, orthopnea, PND, swelling in lower extremities, anasarca, dizziness, palpitations Resp:+  shortness of breath with exertion or at rest.              No-   productive cough,  No non-productive cough,  No- coughing up of blood.              No-   change in color of mucus.  No-  wheezing.   Skin: No-   rash or lesions. GI:  No-   heartburn, indigestion, abdominal pain, nausea, vomiting, diarrhea,                 change in bowel habits, loss of appetite GU: No-   dysuria, change in color of urine, no urgency or frequency.  No- flank pain. MS:  No-   joint pain or swelling.  No- decreased range of motion.  No- back pain. Neuro-     nothing unusual Psych:  No- change in mood or affect. No depression or anxiety.  No memory loss.  OBJ- Physical Exam General- Alert, Oriented, Affect-appropriate, Distress- none acute Well-appearing Skin- rash-none, lesions- none, excoriation- none Lymphadenopathy- none Head- atraumatic            Eyes- Gross vision intact, PERRLA, conjunctivae and secretions clear            Ears- Hearing, canals-normal            Nose- Clear, no-Septal dev, mucus, polyps, erosion, perforation             Throat- Mallampati III , mucosa clear , drainage- none, tonsils- atrophic Neck- flexible , trachea midline, no stridor , thyroid nl, carotid no bruit Chest - symmetrical excursion , unlabored           Heart/CV- RRR , no murmur , no gallop  , no rub, nl s1 s2                           - JVD- none , edema- none, stasis changes- none, varices- none           Lung- clear to P&A, wheeze- none, cough- none , dullness-none, rub- none           Chest wall-  Abd- tender-no, distended-no, bowel sounds-present, HSM- no Br/ Gen/ Rectal- Not done, not indicated Extrem- cyanosis- none, clubbing, none, atrophy- none, strength- nl Neuro- grossly intact to observation

## 2011-10-27 NOTE — Patient Instructions (Addendum)
Order- CXR   Dx dyspnea  Order PFT         When you feel an episode with your breathing starting- try breathing in and out of a paper bag to correct your breathing pattern

## 2011-11-02 DIAGNOSIS — R0600 Dyspnea, unspecified: Secondary | ICD-10-CM | POA: Insufficient documentation

## 2011-11-02 NOTE — Assessment & Plan Note (Addendum)
Her description of her hands tingling when her dyspnea episodes come on, certainly suggests hyperventilation. This would fit with her known history of anxiety attacks, and her impression that Xanax helps. There may be a component of asthma suggested by her impression that her rescue inhaler helps. Asthma and reflux both might contribute to symptoms. Plan-try managing his hyperventilation, with education on bag breathing.

## 2011-12-10 ENCOUNTER — Ambulatory Visit: Payer: Medicaid Other | Admitting: Physical Therapy

## 2011-12-11 ENCOUNTER — Ambulatory Visit (INDEPENDENT_AMBULATORY_CARE_PROVIDER_SITE_OTHER): Payer: Medicaid Other | Admitting: Internal Medicine

## 2011-12-11 ENCOUNTER — Encounter: Payer: Self-pay | Admitting: Internal Medicine

## 2011-12-11 VITALS — BP 130/76 | HR 109 | Ht 65.0 in | Wt 208.0 lb

## 2011-12-11 DIAGNOSIS — R0600 Dyspnea, unspecified: Secondary | ICD-10-CM

## 2011-12-11 DIAGNOSIS — J45909 Unspecified asthma, uncomplicated: Secondary | ICD-10-CM

## 2011-12-11 DIAGNOSIS — J45998 Other asthma: Secondary | ICD-10-CM

## 2011-12-11 DIAGNOSIS — R0609 Other forms of dyspnea: Secondary | ICD-10-CM

## 2011-12-11 LAB — PULMONARY FUNCTION TEST

## 2011-12-11 MED ORDER — IPRATROPIUM BROMIDE HFA 17 MCG/ACT IN AERS: 2.0000 | INHALATION_SPRAY | Freq: Four times a day (QID) | RESPIRATORY_TRACT | Status: DC

## 2011-12-11 NOTE — Patient Instructions (Addendum)
Script sent for rescue inhaler atrovent/ ipratropium  To use up to 4 times daily if needed for shortness of breath/ chest tightness.  Use the technique of breathing in and out of a paper bag over your mouth when you get the smothering sensation  Please call as needed

## 2011-12-11 NOTE — Progress Notes (Signed)
PFT done today. 

## 2011-12-11 NOTE — Progress Notes (Signed)
10/27/11- 41 yoF former smoker referred courtesy of Dr. Lovell Sheehan because of dyspnea. She describes episode of shortness of breath "like about to pass out", associated with substernal pressure pain and heart pounding. This has been happening 2 or 3 times a month over the past 7 years. Triggers include heat and exertion. Relieved by sitting down and turning off and. Episodes are happening 2 or 3 times a month with no pattern. They can last an hour at a time, but usually 15 or 20 minutes. There may be wheeze and cough with scant phlegm. Hands tingle. She describes dyspnea on exertion from trying to keep up with her 83-year-old child. Emergency room visit 03/05/2011 nothing wrong by her report. She admits a history of anxiety attacks. She has a history of GERD treated with Prevacid. There is no history of asthma or pneumonia. Medical problems include hypertension, type 2 diabetes, allergic rhinitis chronic headache. She takes Xanax about twice a day for anxiety. Rescue inhaler helps the shortness of breath.  CXR 03/15/11-reviewed with her IMPRESSION:  No active cardiopulmonary disease.  Original Report Authenticated By: Cyndie Chime, M.D.   12/11/11- 41 yoF former smoker referred courtesy of Dr. Lovell Sheehan because of dyspnea. FOLLOWS FOR: still having SOB as before; review PFT results.  Still an occasional episode of shortness of breath. After discussion last visit, she does recognize some hyperventilation and has found that bag-breathing did help. PFT: 12/11/2011-Moderate obstructive airways disease with response to bronchodilator. Moderate restriction of exhaled volume , Mild reduction in diffusion capacity. FEV1 1.72/63%, FEV1/FVC 0.76, FEF 25-75% 1.44/46%. TLC 63%, DLCO 68%. CXR 10/ 9 / 13-reviewed IMPRESSION:  Stable. Normal exam.  Original Report Authenticated By: ERIC A. MANSELL, M.D.  ROS-see HPI Constitutional:   No-   weight loss, night sweats, fevers, chills, fatigue, lassitude. HEENT:   No-   headaches, difficulty swallowing, tooth/dental problems, sore throat,       No-  sneezing, itching, ear ache, nasal congestion, post nasal drip,  CV:  No-   +chest pain, orthopnea, PND, swelling in lower extremities, anasarca, dizziness, palpitations Resp:+  shortness of breath with exertion or at rest.              No-   productive cough,  No non-productive cough,  No- coughing up of blood.              No-   change in color of mucus.  No- wheezing.   Skin: No-   rash or lesions. GI:  No-   heartburn, indigestion, abdominal pain, nausea, vomiting,  GU: . MS:  No-   joint pain or swelling.  Neuro-     nothing unusual Psych:  No- change in mood or affect. No depression or anxiety.  No memory loss.  OBJ- Physical Exam General- Alert, Oriented, Affect-appropriate, Distress- none acute Well-appearing Skin- rash-none, lesions- none, excoriation- none Lymphadenopathy- none Head- atraumatic            Eyes- Gross vision intact, PERRLA, conjunctivae and secretions clear            Ears- Hearing, canals-normal            Nose- Clear, no-Septal dev, mucus, polyps, erosion, perforation             Throat- Mallampati III , mucosa clear , drainage- none, tonsils- atrophic Neck- flexible , trachea midline, no stridor , thyroid nl, carotid no bruit Chest - + shallow, hesitant inspiratory effort gives impression that she is not taking a  deep breath.           Heart/CV- RRR , no murmur , no gallop  , no rub, nl s1 s2                           - JVD- none , edema- none, stasis changes- none, varices- none           Lung- clear to P&A, wheeze- none, cough- none , dullness-none, rub- none           Chest wall-  Abd-  Br/ Gen/ Rectal- Not done, not indicated Extrem- cyanosis- none, clubbing, none, atrophy- none, strength- nl Neuro- grossly intact to observation

## 2011-12-15 ENCOUNTER — Ambulatory Visit: Payer: Medicaid Other | Attending: Orthopedic Surgery | Admitting: Physical Therapy

## 2011-12-15 DIAGNOSIS — M545 Low back pain, unspecified: Secondary | ICD-10-CM | POA: Insufficient documentation

## 2011-12-15 DIAGNOSIS — IMO0001 Reserved for inherently not codable concepts without codable children: Secondary | ICD-10-CM | POA: Insufficient documentation

## 2011-12-15 DIAGNOSIS — M2560 Stiffness of unspecified joint, not elsewhere classified: Secondary | ICD-10-CM | POA: Insufficient documentation

## 2011-12-23 DIAGNOSIS — J45998 Other asthma: Secondary | ICD-10-CM | POA: Insufficient documentation

## 2011-12-23 NOTE — Assessment & Plan Note (Signed)
PFT certainly indicates reactive airways disease consistent with a diagnosis of asthma. She gives me the impression that there are anxiety and hyperventilation components at times.  Plan-none stimulating rescue inhaler-Atrovent for trial

## 2012-02-24 ENCOUNTER — Ambulatory Visit: Payer: Medicaid Other | Attending: Orthopedic Surgery | Admitting: Physical Therapy

## 2012-02-24 DIAGNOSIS — M545 Low back pain, unspecified: Secondary | ICD-10-CM | POA: Insufficient documentation

## 2012-02-24 DIAGNOSIS — M2560 Stiffness of unspecified joint, not elsewhere classified: Secondary | ICD-10-CM | POA: Insufficient documentation

## 2012-02-24 DIAGNOSIS — IMO0001 Reserved for inherently not codable concepts without codable children: Secondary | ICD-10-CM | POA: Insufficient documentation

## 2012-03-02 ENCOUNTER — Ambulatory Visit: Payer: Medicaid Other | Admitting: Physical Therapy

## 2012-03-09 ENCOUNTER — Ambulatory Visit: Payer: Medicaid Other | Admitting: Physical Therapy

## 2012-03-18 ENCOUNTER — Encounter: Payer: Medicaid Other | Admitting: Physical Therapy

## 2012-03-18 ENCOUNTER — Ambulatory Visit: Payer: Medicaid Other | Admitting: Rehabilitation

## 2012-04-06 ENCOUNTER — Emergency Department (HOSPITAL_COMMUNITY)
Admission: EM | Admit: 2012-04-06 | Discharge: 2012-04-06 | Disposition: A | Discharge status: Home / Self Care | Payer: Medicaid Other | Attending: Emergency Medicine | Admitting: Emergency Medicine

## 2012-04-06 ENCOUNTER — Encounter (HOSPITAL_COMMUNITY): Payer: Self-pay | Admitting: Emergency Medicine

## 2012-04-06 DIAGNOSIS — E1169 Type 2 diabetes mellitus with other specified complication: Secondary | ICD-10-CM | POA: Insufficient documentation

## 2012-04-06 DIAGNOSIS — R5381 Other malaise: Secondary | ICD-10-CM | POA: Insufficient documentation

## 2012-04-06 DIAGNOSIS — R300 Dysuria: Secondary | ICD-10-CM | POA: Insufficient documentation

## 2012-04-06 DIAGNOSIS — R350 Frequency of micturition: Secondary | ICD-10-CM | POA: Insufficient documentation

## 2012-04-06 DIAGNOSIS — Z3202 Encounter for pregnancy test, result negative: Secondary | ICD-10-CM | POA: Insufficient documentation

## 2012-04-06 DIAGNOSIS — Z79899 Other long term (current) drug therapy: Secondary | ICD-10-CM | POA: Insufficient documentation

## 2012-04-06 DIAGNOSIS — R631 Polydipsia: Secondary | ICD-10-CM | POA: Insufficient documentation

## 2012-04-06 DIAGNOSIS — Z87891 Personal history of nicotine dependence: Secondary | ICD-10-CM | POA: Insufficient documentation

## 2012-04-06 DIAGNOSIS — I10 Essential (primary) hypertension: Secondary | ICD-10-CM | POA: Insufficient documentation

## 2012-04-06 DIAGNOSIS — F411 Generalized anxiety disorder: Secondary | ICD-10-CM | POA: Insufficient documentation

## 2012-04-06 DIAGNOSIS — R3589 Other polyuria: Secondary | ICD-10-CM | POA: Insufficient documentation

## 2012-04-06 LAB — COMPREHENSIVE METABOLIC PANEL
ALT: 17 U/L (ref 0–35)
AST: 17 U/L (ref 0–37)
Alkaline Phosphatase: 52 U/L (ref 39–117)
CO2: 18 mEq/L — ABNORMAL LOW (ref 19–32)
Chloride: 92 mEq/L — ABNORMAL LOW (ref 96–112)
GFR calc Af Amer: >90 mL/min (ref >90)
GFR calc non Af Amer: >90 mL/min (ref >90)
Glucose, Bld: 422 mg/dL — ABNORMAL HIGH (ref 70–99)
Potassium: 4.5 mEq/L (ref 3.5–5.1)
Sodium: 124 mEq/L — ABNORMAL LOW (ref 135–145)

## 2012-04-06 LAB — URINALYSIS, ROUTINE W REFLEX MICROSCOPIC
Bilirubin Urine: NEGATIVE
Hgb urine dipstick: NEGATIVE
Ketones, ur: NEGATIVE mg/dL
Nitrite: NEGATIVE
Urobilinogen, UA: 0.2 mg/dL (ref 0.0–1.0)

## 2012-04-06 LAB — GLUCOSE, CAPILLARY
Glucose-Capillary: 438 mg/dL — ABNORMAL HIGH (ref 70–99)
Glucose-Capillary: 246 mg/dL — ABNORMAL HIGH (ref 70–99)
Glucose-Capillary: 232 mg/dL — ABNORMAL HIGH (ref 70–99)

## 2012-04-06 LAB — BASIC METABOLIC PANEL
CO2: 22 mEq/L (ref 19–32)
Chloride: 100 mEq/L (ref 96–112)
Creatinine, Ser: 0.72 mg/dL (ref 0.50–1.10)
GFR calc Af Amer: >90 mL/min (ref >90)
Potassium: 4.1 mEq/L (ref 3.5–5.1)

## 2012-04-06 LAB — CBC WITH DIFFERENTIAL/PLATELET
Basophils Absolute: 0 10*3/uL (ref 0.0–0.1)
Lymphocytes Relative: 31 % (ref 12–46)
Lymphs Abs: 2.9 10*3/uL (ref 0.7–4.0)
MCV: 81.6 fL (ref 78.0–100.0)
Neutro Abs: 5.9 10*3/uL (ref 1.7–7.7)
Neutrophils Relative %: 63 % (ref 43–77)
Platelets: 262 10*3/uL (ref 150–400)
RBC: 5.54 MIL/uL — ABNORMAL HIGH (ref 3.87–5.11)
WBC: 9.3 10*3/uL (ref 4.0–10.5)

## 2012-04-06 MED ORDER — INSULIN ASPART 100 UNIT/ML IV SOLN
10.0000 [IU] | Freq: Once | INTRAVENOUS | Status: AC
  Administered 2012-04-06: 10 [IU] via INTRAVENOUS
  Filled 2012-04-06: qty 0.1

## 2012-04-06 MED ORDER — SODIUM CHLORIDE 0.9 % IV BOLUS (SEPSIS)
1000.0000 mL | Freq: Once | INTRAVENOUS | Status: AC
  Administered 2012-04-06: 1000 mL via INTRAVENOUS

## 2012-04-06 MED ORDER — TRAMADOL HCL 50 MG PO TABS
50.0000 mg | ORAL_TABLET | Freq: Once | ORAL | Status: AC
  Administered 2012-04-06: 50 mg via ORAL
  Filled 2012-04-06: qty 1

## 2012-04-06 NOTE — ED Notes (Signed)
Bed:WA25<BR> Expected date:<BR> Expected time:<BR> Means of arrival:<BR> Comments:<BR> Hold for triage

## 2012-04-06 NOTE — ED Notes (Signed)
Pt states that her BS has been labile recently and came in because it was in 300s. CBG 438 upon arrival. Also states urinary burning. Polydipsia.

## 2012-04-06 NOTE — ED Provider Notes (Signed)
History     CSN: [id number]  Arrival date & time 04/06/12  1549   First MD Initiated Contact with Patient 04/06/12 1620      Chief Complaint  Patient presents with  . Hyperglycemia    (Consider location/radiation/quality/duration/timing/severity/associated sxs/prior treatment) HPI Pt was recently diagnosed with DM and is being treated with glipizide by PMD. Pt reports that though she has been compliant with meds and diet, her blood sugar varies between 200-400. She admits to polydipsia and polyuria with mild dysuria. No fever or chills. No cough or SOB. No URI symptoms. No abdominal pain.  Past Medical History  Diagnosis Date  . Hypertension   . Anxiety   . Diabetes mellitus     Past Surgical History  Procedure Laterality Date  . Joint replacement      right elbow replacement in 2010 that was "removed in 2011"    History reviewed. No pertinent family history.  History  Substance Use Topics  . Smoking status: Former Smoker -- 0.50 packs/day for 10 years    Types: Cigarettes    Quit date: 01/21/2007  . Smokeless tobacco: Not on file  . Alcohol Use: Yes     Comment: occasional    OB History   Grav Para Term Preterm Abortions TAB SAB Ect Mult Living                  Review of Systems  Constitutional: Positive for fatigue. Negative for fever and chills.  HENT: Negative for congestion, sore throat, rhinorrhea and sinus pressure.   Respiratory: Negative for cough and shortness of breath.   Cardiovascular: Negative for chest pain.  Gastrointestinal: Negative for nausea, vomiting and abdominal pain.  Endocrine: Positive for polydipsia and polyuria.  Genitourinary: Positive for dysuria and frequency.  Musculoskeletal: Negative for myalgias and back pain.  Skin: Negative for color change, pallor, rash and wound.  Neurological: Negative for dizziness, weakness, light-headedness, numbness and headaches.  Psychiatric/Behavioral: The patient is nervous/anxious.   All  other systems reviewed and are negative.    Allergies  Review of patient's allergies indicates no known allergies.  Home Medications   Current Outpatient Rx  Name  Route  Sig  Dispense  Refill  . ALPRAZolam (XANAX) 0.5 MG tablet   Oral   Take 0.5 mg by mouth 3 (three) times daily as needed (anxiety).          Marland Kitchen atenolol (TENORMIN) 100 MG tablet   Oral   Take 100 mg by mouth daily.         . cetirizine (ZYRTEC) 10 MG tablet   Oral   Take 10 mg by mouth daily.         . cyclobenzaprine (FLEXERIL) 10 MG tablet   Oral   Take 10 mg by mouth 2 (two) times daily as needed. Pain         . glipiZIDE (GLUCOTROL) 5 MG tablet   Oral   Take 5 mg by mouth 2 (two) times daily before a meal.         . ibuprofen (ADVIL,MOTRIN) 800 MG tablet   Oral   Take 800 mg by mouth every 8 (eight) hours as needed. Pain         . ipratropium (ATROVENT HFA) 17 MCG/ACT inhaler   Inhalation   Inhale 2 puffs into the lungs every 6 (six) hours. As needed   1 Inhaler   12   . lisinopril (PRINIVIL,ZESTRIL) 30 MG tablet   Oral  Take 30 mg by mouth daily.         Marland Kitchen omeprazole (PRILOSEC) 20 MG capsule   Oral   Take 20 mg by mouth daily.         . promethazine (PHENERGAN) 25 MG tablet   Oral   Take 25 mg by mouth every 6 (six) hours as needed. Nausea         . traMADol (ULTRAM) 50 MG tablet   Oral   Take 50 mg by mouth every 6 (six) hours as needed. Pain         . zolpidem (AMBIEN) 5 MG tablet   Oral   Take 5 mg by mouth at bedtime as needed. Insomnia           BP 124/82  Pulse 123  Temp(Src) 98.3 F (36.8 C) (Oral)  SpO2 99%  LMP 02/04/2012  Physical Exam  Nursing note and vitals reviewed. Constitutional: She is oriented to person, place, and time. She appears well-developed and well-nourished. No distress.  HENT:  Head: Normocephalic and atraumatic.  Mouth/Throat: Oropharynx is clear and moist.  Eyes: EOM are normal. Pupils are equal, round, and reactive to  light.  Neck: Normal range of motion. Neck supple.  Cardiovascular: Normal rate and regular rhythm.   Pulmonary/Chest: Effort normal and breath sounds normal. No respiratory distress. She has no wheezes. She has no rales. She exhibits no tenderness.  Abdominal: Soft. Bowel sounds are normal. She exhibits no distension and no mass. There is no tenderness. There is no rebound and no guarding.  Musculoskeletal: Normal range of motion. She exhibits no edema and no tenderness.  Left great toe nail thickening.   Neurological: She is alert and oriented to person, place, and time.  Skin: Skin is warm and dry. No rash noted. No erythema.  Psychiatric: She has a normal mood and affect. Her behavior is normal.    ED Course  Procedures (including critical care time)  Labs Reviewed  CBC WITH DIFFERENTIAL - Abnormal; Notable for the following:    RBC 5.54 (*)    Hemoglobin 15.1 (*)    All other components within normal limits  COMPREHENSIVE METABOLIC PANEL - Abnormal; Notable for the following:    Sodium 124 (*)    Chloride 92 (*)    CO2 18 (*)    Glucose, Bld 422 (*)    Total Bilirubin 0.2 (*)    All other components within normal limits  URINALYSIS, ROUTINE W REFLEX MICROSCOPIC - Abnormal; Notable for the following:    Specific Gravity, Urine 1.034 (*)    Glucose, UA 500 (*)    All other components within normal limits  GLUCOSE, CAPILLARY - Abnormal; Notable for the following:    Glucose-Capillary 438 (*)    All other components within normal limits  GLUCOSE, CAPILLARY - Abnormal; Notable for the following:    Glucose-Capillary 246 (*)    All other components within normal limits  BASIC METABOLIC PANEL - Abnormal; Notable for the following:    Sodium 132 (*)    Glucose, Bld 259 (*)    All other components within normal limits  GLUCOSE, CAPILLARY - Abnormal; Notable for the following:    Glucose-Capillary 232 (*)    All other components within normal limits  PREGNANCY, URINE   No  results found.   1. Hyperglycemia       MDM   Pt remains well appearing. Glucose improved with IVF and insulin. Adivsed to f/u with PMD. Return for acute  concerns       Loren Racer, MD 04/06/12 2055

## 2012-04-08 ENCOUNTER — Ambulatory Visit: Payer: Medicaid Other | Admitting: Physical Therapy

## 2012-04-13 ENCOUNTER — Ambulatory Visit: Payer: Medicaid Other | Attending: Orthopedic Surgery | Admitting: Physical Therapy

## 2012-04-13 DIAGNOSIS — M545 Low back pain, unspecified: Secondary | ICD-10-CM | POA: Insufficient documentation

## 2012-04-13 DIAGNOSIS — IMO0001 Reserved for inherently not codable concepts without codable children: Secondary | ICD-10-CM | POA: Insufficient documentation

## 2012-04-13 DIAGNOSIS — M2560 Stiffness of unspecified joint, not elsewhere classified: Secondary | ICD-10-CM | POA: Insufficient documentation

## 2012-04-22 ENCOUNTER — Encounter: Payer: Medicaid Other | Attending: Internal Medicine | Admitting: Dietician

## 2012-04-22 DIAGNOSIS — Z713 Dietary counseling and surveillance: Secondary | ICD-10-CM | POA: Insufficient documentation

## 2012-04-22 DIAGNOSIS — E119 Type 2 diabetes mellitus without complications: Secondary | ICD-10-CM | POA: Insufficient documentation

## 2012-04-22 NOTE — Progress Notes (Signed)
  Patient was seen on 04/22/2012 for the second of a series of three diabetes self-management courses at the [facility]. The following learning objectives were met by the patient during this course:   Explain basic nutrition maintenance and quality assurance  Describe causes, symptoms and treatment of hypoglycemia and hyperglycemia  Explain how to manage diabetes during illness  Describe the importance of good nutrition for health and healthy eating strategies  List strategies to follow meal plan when dining out  Describe the effects of alcohol on glucose and how to use it safely  Describe problem solving skills for day-to-day glucose challenges  Describe strategies to use when treatment plan needs to change  Identify important factors involved in successful weight loss  Describe ways to remain physically active  Describe the impact of regular activity on insulin resistance  Handouts given in class:  Refrigerator magnet for Sick Day Guidelines  [facility] Oral medication/insulin handout  Follow-Up Plan: Patient will attend the final class of the ADA Diabetes Self-Care Education.

## 2012-05-06 ENCOUNTER — Encounter: Payer: Medicaid Other | Admitting: *Deleted

## 2012-05-06 DIAGNOSIS — E119 Type 2 diabetes mellitus without complications: Secondary | ICD-10-CM

## 2012-05-06 NOTE — Progress Notes (Signed)
  Patient was seen on 05/06/12 for the third of a series of three diabetes self-management courses at the [facility]. The following learning objectives were met by the patient during this course:    Describe how diabetes changes over time   Identify diabetes complications and ways to prevent them   Describe strategies that can promote heart health including lowering blood pressure and cholesterol   Describe strategies to lower dietary fat and sodium in the diet   Identify physical activities that benefit cardiovascular health   Evaluate success in meeting personal goal   Describe the belief that they can live successfully with diabetes day to day   Establish 2-3 goals that they will plan to diligently work on until they return for the free 37-month follow-up visit  The following handouts were given in class:  3 Month Follow Up Visit handout  Goal setting handout  Class evaluation form  Your patient has established the following 3 month goals for diabetes self-care:  To help manage stress, I will do cross word at least 3 times a week  Reduce fat in my diet by eating less sweets  Follow-Up Plan: Patient will attend a 3 month follow-up visit for diabetes self-management education.

## 2012-08-26 ENCOUNTER — Encounter (HOSPITAL_COMMUNITY): Payer: Self-pay | Admitting: Emergency Medicine

## 2012-08-26 ENCOUNTER — Emergency Department (HOSPITAL_COMMUNITY): Payer: Medicaid Other

## 2012-08-26 ENCOUNTER — Ambulatory Visit: Payer: Medicaid Other | Admitting: *Deleted

## 2012-08-26 ENCOUNTER — Emergency Department (HOSPITAL_COMMUNITY)
Admission: EM | Admit: 2012-08-26 | Discharge: 2012-08-26 | Disposition: A | Discharge status: Home / Self Care | Payer: Medicaid Other | Attending: Emergency Medicine | Admitting: Emergency Medicine

## 2012-08-26 DIAGNOSIS — R070 Pain in throat: Secondary | ICD-10-CM | POA: Insufficient documentation

## 2012-08-26 DIAGNOSIS — Z79899 Other long term (current) drug therapy: Secondary | ICD-10-CM | POA: Insufficient documentation

## 2012-08-26 DIAGNOSIS — E119 Type 2 diabetes mellitus without complications: Secondary | ICD-10-CM | POA: Insufficient documentation

## 2012-08-26 DIAGNOSIS — J40 Bronchitis, not specified as acute or chronic: Secondary | ICD-10-CM

## 2012-08-26 DIAGNOSIS — F411 Generalized anxiety disorder: Secondary | ICD-10-CM | POA: Insufficient documentation

## 2012-08-26 DIAGNOSIS — R1110 Vomiting, unspecified: Secondary | ICD-10-CM | POA: Insufficient documentation

## 2012-08-26 DIAGNOSIS — J209 Acute bronchitis, unspecified: Secondary | ICD-10-CM | POA: Insufficient documentation

## 2012-08-26 DIAGNOSIS — R51 Headache: Secondary | ICD-10-CM | POA: Insufficient documentation

## 2012-08-26 DIAGNOSIS — J029 Acute pharyngitis, unspecified: Secondary | ICD-10-CM | POA: Insufficient documentation

## 2012-08-26 DIAGNOSIS — R0602 Shortness of breath: Secondary | ICD-10-CM | POA: Insufficient documentation

## 2012-08-26 DIAGNOSIS — R509 Fever, unspecified: Secondary | ICD-10-CM | POA: Insufficient documentation

## 2012-08-26 DIAGNOSIS — R197 Diarrhea, unspecified: Secondary | ICD-10-CM | POA: Insufficient documentation

## 2012-08-26 DIAGNOSIS — Z87891 Personal history of nicotine dependence: Secondary | ICD-10-CM | POA: Insufficient documentation

## 2012-08-26 DIAGNOSIS — R112 Nausea with vomiting, unspecified: Secondary | ICD-10-CM | POA: Insufficient documentation

## 2012-08-26 DIAGNOSIS — I10 Essential (primary) hypertension: Secondary | ICD-10-CM | POA: Insufficient documentation

## 2012-08-26 MED ORDER — PREDNISONE 10 MG PO TABS: ORAL_TABLET | ORAL | Status: DC

## 2012-08-26 MED ORDER — AZITHROMYCIN 250 MG PO TABS: 250.0000 mg | ORAL_TABLET | Freq: Every day | ORAL | Status: DC

## 2012-08-26 MED ORDER — ALBUTEROL SULFATE (5 MG/ML) 0.5% IN NEBU
2.5000 mg | INHALATION_SOLUTION | Freq: Once | RESPIRATORY_TRACT | Status: AC
  Administered 2012-08-26: 2.5 mg via RESPIRATORY_TRACT
  Filled 2012-08-26: qty 0.5

## 2012-08-26 MED ORDER — IPRATROPIUM BROMIDE 0.02 % IN SOLN
0.5000 mg | Freq: Once | RESPIRATORY_TRACT | Status: AC
  Administered 2012-08-26: 0.5 mg via RESPIRATORY_TRACT
  Filled 2012-08-26: qty 2.5

## 2012-08-26 MED ORDER — HYDROCOD POLST-CHLORPHEN POLST 10-8 MG/5ML PO LQCR
5.0000 mL | Freq: Once | ORAL | Status: AC
  Administered 2012-08-26: 5 mL via ORAL
  Filled 2012-08-26: qty 5

## 2012-08-26 MED ORDER — HYDROCODONE-HOMATROPINE 5-1.5 MG/5ML PO SYRP: 5.0000 mL | ORAL_SOLUTION | Freq: Four times a day (QID) | ORAL | Status: DC | PRN

## 2012-08-26 MED ORDER — PREDNISONE 20 MG PO TABS
60.0000 mg | ORAL_TABLET | Freq: Once | ORAL | Status: AC
  Administered 2012-08-26: 60 mg via ORAL
  Filled 2012-08-26: qty 3

## 2012-08-26 MED ORDER — ALBUTEROL SULFATE HFA 108 (90 BASE) MCG/ACT IN AERS
2.0000 | INHALATION_SPRAY | Freq: Once | RESPIRATORY_TRACT | Status: AC
  Administered 2012-08-26: 2 via RESPIRATORY_TRACT
  Filled 2012-08-26: qty 6.7

## 2012-08-26 NOTE — ED Notes (Signed)
Pt states that she has been having cough x 2 wks.  Now c/o CP every time she coughs.

## 2012-08-26 NOTE — ED Provider Notes (Signed)
CSN: [id number]     Arrival date & time 08/26/12  1758 History  This chart was scribed for non-physician practitioner working with [name], MD by Ashley Jacobs, ED scribe. This patient was seen in room WTR5/WTR5 and the patient's care was started at 7:05 PM.   First MD Initiated Contact with Patient 08/26/12 1833     Chief Complaint  Patient presents with  . Cough   (Consider location/radiation/quality/duration/timing/severity/associated sxs/prior Treatment) The history is provided by the patient and medical records. No language interpreter was used.   HPI Comments: Taylor [name] is a 42 y.o. female who presents to the Emergency Department complaining of moderate gradually worsening productive cough that presented two weeks PTA. Pt mentions she experiences  throat pain, dry heaving, SOB, diarrhea,vomiting as associated symptoms. Pt denies that anything relieves or worsen the symptoms. Pt has asthma and is currently taking Atrovent but is only having slight improvements with the drug. She mentions that she has to sit up while sleeping or turn on A/C. No relief with over the counter medications. No chest pain. No recent travel or surgeries.   Pt hx of smoking for 10 years and does not currently smoke. However she mentions that she is around smokers.  Past Medical History  Diagnosis Date  . Hypertension   . Anxiety   . Diabetes mellitus    Past Surgical History  Procedure Laterality Date  . Joint replacement      right elbow replacement in 2010 that was "removed in 2011"   No family history on file. History  Substance Use Topics  . Smoking status: Former Smoker -- 0.50 packs/day for 10 years    Types: Cigarettes    Quit date: 01/21/2007  . Smokeless tobacco: Not on file  . Alcohol Use: Yes     Comment: occasional   OB History   Grav Para Term Preterm Abortions TAB SAB Ect Mult Living                 Review of Systems  Constitutional: Positive for fever (mild) and  chills.  HENT: Positive for sore throat. Negative for neck pain and neck stiffness.   Respiratory: Positive for cough and shortness of breath.   Cardiovascular: Negative.   Gastrointestinal: Positive for nausea, vomiting and diarrhea.  Neurological: Positive for headaches.  All other systems reviewed and are negative.    Allergies  Review of patient's allergies indicates no known allergies.  Home Medications   Current Outpatient Rx  Name  Route  Sig  Dispense  Refill  . ALPRAZolam (XANAX) 0.5 MG tablet   Oral   Take 0.5 mg by mouth 3 (three) times daily as needed (anxiety).          Marland Kitchen atenolol (TENORMIN) 100 MG tablet   Oral   Take 100 mg by mouth daily.         . cetirizine (ZYRTEC) 10 MG tablet   Oral   Take 10 mg by mouth daily.         . cyclobenzaprine (FLEXERIL) 10 MG tablet   Oral   Take 10 mg by mouth 2 (two) times daily as needed. Pain         . glipiZIDE (GLUCOTROL) 5 MG tablet   Oral   Take 5 mg by mouth 2 (two) times daily before a meal.         . ibuprofen (ADVIL,MOTRIN) 800 MG tablet   Oral   Take 800 mg by mouth  every 8 (eight) hours as needed. Pain         . ipratropium (ATROVENT HFA) 17 MCG/ACT inhaler   Inhalation   Inhale 2 puffs into the lungs every 6 (six) hours. As needed   1 Inhaler   12   . lisinopril (PRINIVIL,ZESTRIL) 30 MG tablet   Oral   Take 30 mg by mouth daily.         Marland Kitchen omeprazole (PRILOSEC) 20 MG capsule   Oral   Take 20 mg by mouth daily.         . promethazine (PHENERGAN) 25 MG tablet   Oral   Take 25 mg by mouth every 6 (six) hours as needed. Nausea         . traMADol (ULTRAM) 50 MG tablet   Oral   Take 50 mg by mouth every 6 (six) hours as needed. Pain         . zolpidem (AMBIEN) 5 MG tablet   Oral   Take 5 mg by mouth at bedtime as needed. Insomnia          BP 140/94  Pulse 111  Temp(Src) 98.5 F (36.9 C) (Oral)  Resp 25  SpO2 100%  LMP 05/26/2012 Physical Exam  Nursing note and  vitals reviewed. Constitutional: She is oriented to person, place, and time. She appears well-developed and well-nourished. No distress.  HENT:  Head: Normocephalic and atraumatic.  Eyes: Conjunctivae and EOM are normal. Right eye exhibits no discharge. Left eye exhibits no discharge. No scleral icterus.  Neck: Normal range of motion. Neck supple.  Cardiovascular: Normal rate and regular rhythm.   Pulmonary/Chest: Effort normal. She has no wheezes. She has no rales.  Decreased air movement bilaterally  Musculoskeletal: Normal range of motion. She exhibits no edema.  Neurological: She is oriented to person, place, and time. No cranial nerve deficit.  Skin: Skin is warm and dry. She is not diaphoretic.  Psychiatric: She has a normal mood and affect. Her behavior is normal.    ED Course  DIAGNOSTIC STUDIES: Oxygen Saturation is 100% on room air, normal by my interpretation.    COORDINATION OF CARE: 7:09 PM Discussed course of care with pt which includes inhaler and chest x-ray. Pt understands and agrees.   Procedures (including critical care time)  Labs Reviewed - No data to display No results found.  1. Bronchitis     MDM  PT with persistent now productive cough for 2 wks with post tussive emesis. CXR negative. NO relief with over the counter medications. Hx of asthma. Pt has decreased air movement on exam, no wheezes. She felt much better with one neb, will try another. Home with prednisone short taper, hycodan for cough, zpack, inhaler. Pt has no chest pain, no risk factors for PE, no other complaints.    Filed Vitals:   08/26/12 1809 08/26/12 1931  BP: 140/94   Pulse: 111 118  Temp: 98.5 F (36.9 C)   TempSrc: Oral   Resp: 25 20  SpO2: 100% 100%    I personally performed the services described in this documentation, which was scribed in my presence. The recorded information has been reviewed and is accurate.     Lottie Mussel, PA-C 08/26/12 2006

## 2012-08-27 NOTE — ED Provider Notes (Signed)
Medical screening examination/treatment/procedure(s) were performed by non-physician practitioner and as supervising physician I was immediately available for consultation/collaboration.   [name], MD 08/27/12 [phone]

## 2013-01-19 ENCOUNTER — Encounter (HOSPITAL_COMMUNITY): Payer: Self-pay | Admitting: Emergency Medicine

## 2013-01-19 ENCOUNTER — Emergency Department (HOSPITAL_COMMUNITY)
Admission: EM | Admit: 2013-01-19 | Discharge: 2013-01-19 | Disposition: A | Discharge status: Home / Self Care | Payer: Medicaid Other | Attending: Emergency Medicine | Admitting: Emergency Medicine

## 2013-01-19 DIAGNOSIS — Z79899 Other long term (current) drug therapy: Secondary | ICD-10-CM | POA: Insufficient documentation

## 2013-01-19 DIAGNOSIS — I10 Essential (primary) hypertension: Secondary | ICD-10-CM | POA: Insufficient documentation

## 2013-01-19 DIAGNOSIS — E119 Type 2 diabetes mellitus without complications: Secondary | ICD-10-CM | POA: Insufficient documentation

## 2013-01-19 DIAGNOSIS — F411 Generalized anxiety disorder: Secondary | ICD-10-CM | POA: Insufficient documentation

## 2013-01-19 DIAGNOSIS — B9789 Other viral agents as the cause of diseases classified elsewhere: Secondary | ICD-10-CM

## 2013-01-19 DIAGNOSIS — J069 Acute upper respiratory infection, unspecified: Secondary | ICD-10-CM | POA: Insufficient documentation

## 2013-01-19 DIAGNOSIS — R112 Nausea with vomiting, unspecified: Secondary | ICD-10-CM | POA: Insufficient documentation

## 2013-01-19 DIAGNOSIS — Z87891 Personal history of nicotine dependence: Secondary | ICD-10-CM | POA: Insufficient documentation

## 2013-01-19 DIAGNOSIS — R52 Pain, unspecified: Secondary | ICD-10-CM | POA: Insufficient documentation

## 2013-01-19 DIAGNOSIS — R0789 Other chest pain: Secondary | ICD-10-CM | POA: Insufficient documentation

## 2013-01-19 DIAGNOSIS — Z794 Long term (current) use of insulin: Secondary | ICD-10-CM | POA: Insufficient documentation

## 2013-01-19 DIAGNOSIS — R000 Tachycardia, unspecified: Secondary | ICD-10-CM | POA: Insufficient documentation

## 2013-01-19 DIAGNOSIS — J45901 Unspecified asthma with (acute) exacerbation: Secondary | ICD-10-CM | POA: Insufficient documentation

## 2013-01-19 DIAGNOSIS — R1013 Epigastric pain: Secondary | ICD-10-CM | POA: Insufficient documentation

## 2013-01-19 DIAGNOSIS — IMO0001 Reserved for inherently not codable concepts without codable children: Secondary | ICD-10-CM | POA: Insufficient documentation

## 2013-01-19 LAB — RAPID STREP SCREEN (MED CTR MEBANE ONLY): Streptococcus, Group A Screen (Direct): NEGATIVE

## 2013-01-19 MED ORDER — IPRATROPIUM BROMIDE HFA 17 MCG/ACT IN AERS: 2.0000 | INHALATION_SPRAY | Freq: Four times a day (QID) | RESPIRATORY_TRACT | Status: AC

## 2013-01-19 MED ORDER — ONDANSETRON 8 MG PO TBDP
8.0000 mg | ORAL_TABLET | Freq: Once | ORAL | Status: AC
  Administered 2013-01-19: 8 mg via ORAL
  Filled 2013-01-19: qty 1

## 2013-01-19 MED ORDER — ALBUTEROL SULFATE (2.5 MG/3ML) 0.083% IN NEBU
5.0000 mg | INHALATION_SOLUTION | Freq: Once | RESPIRATORY_TRACT | Status: AC
  Administered 2013-01-19: 5 mg via RESPIRATORY_TRACT
  Filled 2013-01-19: qty 6

## 2013-01-19 MED ORDER — HYDROCOD POLST-CHLORPHEN POLST 10-8 MG/5ML PO LQCR: 5.0000 mL | Freq: Two times a day (BID) | ORAL | Status: DC | PRN

## 2013-01-19 MED ORDER — IPRATROPIUM BROMIDE 0.02 % IN SOLN
0.5000 mg | Freq: Once | RESPIRATORY_TRACT | Status: AC
  Administered 2013-01-19: 0.5 mg via RESPIRATORY_TRACT
  Filled 2013-01-19: qty 2.5

## 2013-01-19 MED ORDER — ALBUTEROL SULFATE HFA 108 (90 BASE) MCG/ACT IN AERS: 1.0000 | INHALATION_SPRAY | Freq: Four times a day (QID) | RESPIRATORY_TRACT | Status: AC | PRN

## 2013-01-19 NOTE — ED Notes (Signed)
Pt c/o cough, body aches, and sore throat since yesterday.  States that her daughters have been sick.

## 2013-01-19 NOTE — ED Provider Notes (Signed)
CSN: [id number]     Arrival date & time 01/19/13  1232 History  This chart was scribed for non-physician practitioner, Trixie Dredge, PA-C working with Junius Argyle, MD by Greggory Stallion, ED scribe. This patient was seen in room WTR6/WTR6 and the patient's care was started at 2:23 PM.   Chief Complaint  Patient presents with  . Generalized Body Aches  . Cough  . Sore Throat   The history is provided by the patient. No language interpreter was used.   HPI Comments: Taylor [name] is a 42 y.o. female with history of asthma who presents to the Emergency Department complaining of cough, generalized body aches, epigastric abdominal pain, nausea, emesis and sore throat that started this morning. She states she is also having rhinorrhea, congestion, sneezing, SOB and chest tightness. Pt is currently out of her inhalers because they are mailed to her so she will not get them for another 2 days. Pt states her daughters were sick with strep throat and have 4 days left of antibiotics. Denies diarrhea.   Past Medical History  Diagnosis Date  . Hypertension   . Anxiety   . Diabetes mellitus    Past Surgical History  Procedure Laterality Date  . Joint replacement      right elbow replacement in 2010 that was "removed in 2011"   No family history on file. History  Substance Use Topics  . Smoking status: Former Smoker -- 0.50 packs/day for 10 years    Types: Cigarettes    Quit date: 01/21/2007  . Smokeless tobacco: Not on file  . Alcohol Use: Yes     Comment: occasional   OB History   Grav Para Term Preterm Abortions TAB SAB Ect Mult Living                 Review of Systems  HENT: Positive for congestion, rhinorrhea, sneezing and sore throat.   Respiratory: Positive for cough, chest tightness and shortness of breath.   Gastrointestinal: Positive for nausea, vomiting and abdominal pain. Negative for diarrhea.  Musculoskeletal: Positive for myalgias.  All other systems reviewed and are  negative.    Allergies  Review of patient's allergies indicates no known allergies.  Home Medications   Current Outpatient Rx  Name  Route  Sig  Dispense  Refill  . ALPRAZolam (XANAX) 0.5 MG tablet   Oral   Take 0.5 mg by mouth 3 (three) times daily as needed (anxiety).          Marland Kitchen atenolol (TENORMIN) 100 MG tablet   Oral   Take 100 mg by mouth daily.         . cetirizine (ZYRTEC) 10 MG tablet   Oral   Take 10 mg by mouth daily.         . cyclobenzaprine (FLEXERIL) 10 MG tablet   Oral   Take 10 mg by mouth 2 (two) times daily as needed. Pain         . glipiZIDE (GLUCOTROL) 5 MG tablet   Oral   Take 5 mg by mouth 2 (two) times daily before a meal.         . insulin glargine (LANTUS) 100 UNIT/ML injection   Subcutaneous   Inject 45 Units into the skin at bedtime.         Marland Kitchen ibuprofen (ADVIL,MOTRIN) 800 MG tablet   Oral   Take 800 mg by mouth every 8 (eight) hours as needed. Pain         .  EXPIRED: ipratropium (ATROVENT HFA) 17 MCG/ACT inhaler   Inhalation   Inhale 2 puffs into the lungs every 6 (six) hours. As needed   1 Inhaler   12   . lisinopril (PRINIVIL,ZESTRIL) 30 MG tablet   Oral   Take 30 mg by mouth daily.         Marland Kitchen omeprazole (PRILOSEC) 20 MG capsule   Oral   Take 20 mg by mouth daily.         . predniSONE (DELTASONE) 10 MG tablet      Take 5 tab day 1, take 4 tab day 2, take 3 tab day 3, take 2 tab day 4, and take 1 tab day 5   15 tablet   0   . promethazine (PHENERGAN) 25 MG tablet   Oral   Take 25 mg by mouth every 6 (six) hours as needed. Nausea         . traMADol (ULTRAM) 50 MG tablet   Oral   Take 50 mg by mouth every 6 (six) hours as needed. Pain         . zolpidem (AMBIEN) 5 MG tablet   Oral   Take 5 mg by mouth at bedtime as needed. Insomnia          BP 147/101  Pulse 115  Temp(Src) 100 F (37.8 C) (Oral)  Resp 18  SpO2 100%  Physical Exam  Nursing note and vitals reviewed. Constitutional: She  appears well-developed and well-nourished. No distress.  HENT:  Head: Normocephalic and atraumatic.  Mouth/Throat: Posterior oropharyngeal edema and posterior oropharyngeal erythema present. No oropharyngeal exudate.  Neck: Neck supple.  Cardiovascular: Regular rhythm and normal heart sounds.  Tachycardia present.   Pulmonary/Chest: Effort normal. No respiratory distress. She has wheezes. She has no rales.  Abdominal: Soft. She exhibits no distension. There is tenderness (generalized). There is no rebound and no guarding.  Lymphadenopathy:    She has cervical adenopathy (anterior).  Neurological: She is alert.  Skin: She is not diaphoretic.    ED Course  Procedures (including critical care time)  DIAGNOSTIC STUDIES: Oxygen Saturation is 100% on RA, normal by my interpretation.    COORDINATION OF CARE: 2:28 PM-Discussed treatment plan which includes breathing treatment and nausea medication with pt at bedside and pt agreed to plan.  3:12 PM-Upon recheck, pt states she is starting to feel better. Her lungs are now clear on physical exam.   Labs Review Labs Reviewed  RAPID STREP SCREEN  CULTURE, GROUP A STREP   Imaging Review No results found.  EKG Interpretation   None       MDM   1. Viral respiratory illness    Patient with symptoms consistent with viral syndrome. She is febrile, multiple respiratory symptoms as well his GI symptoms. Very mild wheezes initially-  Her lungs are clear to auscultation bilaterally and feels better after breathing treatment. Oropharynx with mild edema and erythema. No airway concerns. Strep screen is negative. Culture is pending. Patient discharged to home without albuterol, Tussionex, Atrovent refill.  PCP follow up.  Discussed result, findings, treatment, and follow up  with patient.  Pt given return precautions.  Pt verbalizes understanding and agrees with plan.      I personally performed the services described in this documentation, which  was scribed in my presence. The recorded information has been reviewed and is accurate.   Trixie Dredge, PA-C 01/19/13 [phone]

## 2013-01-21 LAB — CULTURE, GROUP A STREP

## 2013-01-21 NOTE — ED Provider Notes (Signed)
Medical screening examination/treatment/procedure(s) were performed by non-physician practitioner and as supervising physician I was immediately available for consultation/collaboration.  EKG Interpretation   None         Berneda Piccininni S Clayburn Weekly, MD 01/21/13 1105 

## 2013-01-22 ENCOUNTER — Telehealth (HOSPITAL_COMMUNITY): Payer: Self-pay | Admitting: Emergency Medicine

## 2013-01-22 NOTE — ED Notes (Signed)
Post ED Visit - Positive Culture Follow-up: Successful Patient Follow-Up  Culture assessed and recommendations reviewed by: [x]  Wes Dulaney, Pharm.D., BCPS []  Celedonio MiyamotoJeremy Frens, [address] BoulevardPharm[address]D., BCPS []  Georgina PillionElizabeth Martin, Pharm.D., BCPS []  PenrynMinh Pham, [place]arm[place]D., BCPS, AAHIVP []  Estella HuskMichelle Turner, Pharm.D., BCPS, AAHIVP  Positive strep culture  [x]  Patient discharged without antimicrobial prescription and treatment is now indicated []  Organism is resistant to prescribed ED discharge antimicrobial []  Patient with positive blood cultures  Changes discussed with ED provider: Felicie Mornavid Smith NP New antibiotic prescription: if symptomatic, Amoxicillin 500 mg PO BID x 7 days    Zeb ComfortHolland, Jaira Canady 01/22/2013, 6:37 PM

## 2013-01-22 NOTE — Progress Notes (Signed)
ED Antimicrobial Stewardship Positive Culture Follow Up   Taylor [name] is an 43 y.o. female [facility] with a chief complaint of  Chief Complaint  Patient presents with  . Generalized Body Aches  . Cough  . Sore Throat    Recent Results (from the past 720 hour(s))  CULTURE, GROUP A STREP     Status: None   Collection Time    01/19/13  2:35 PM      Result Value Range Status   Specimen Description THROAT   Final   Special Requests NONE   Final   Culture     Final   Value: STREPTOCOCCUS,BETA HEMOLYTIC NOT GROUP A     [facility] at [facility]   Report Status 01/21/2013 FINAL   Final  RAPID STREP SCREEN     Status: None   Collection Time    01/19/13  2:36 PM      Result Value Range Status   Streptococcus, Group A Screen (Direct) NEGATIVE  NEGATIVE Final   Comment: (NOTE)     A Rapid Antigen test may result negative if the antigen level in the     sample is below the detection level of this test. The FDA has not     cleared this test as a stand-alone test therefore the rapid antigen     negative result has reflexed to a Group A Strep culture.    []  Treated with , organism resistant to prescribed antimicrobial [x]  Patient discharged originally without antimicrobial agent and treatment may now be indicated  Recommendation: Perform symptom check. If symptoms are improving, no treatment is indicated. If symptoms persist/worsening, start Amoxicillin 500mg  PO BID x 7 days.  ED Provider: Felicie Mornavid Smith, NP-C   Cleon DewDulaney,  Robert 01/22/2013, 7:15 PM Infectious Diseases Pharmacist Phone# [phone]

## 2013-01-24 ENCOUNTER — Emergency Department (HOSPITAL_COMMUNITY)
Admission: EM | Admit: 2013-01-24 | Discharge: 2013-01-24 | Disposition: A | Discharge status: Home / Self Care | Payer: No Typology Code available for payment source | Attending: Emergency Medicine | Admitting: Emergency Medicine

## 2013-01-24 ENCOUNTER — Encounter (HOSPITAL_COMMUNITY): Payer: Self-pay | Admitting: Emergency Medicine

## 2013-01-24 DIAGNOSIS — Z794 Long term (current) use of insulin: Secondary | ICD-10-CM | POA: Insufficient documentation

## 2013-01-24 DIAGNOSIS — S139XXA Sprain of joints and ligaments of unspecified parts of neck, initial encounter: Secondary | ICD-10-CM | POA: Insufficient documentation

## 2013-01-24 DIAGNOSIS — Z87891 Personal history of nicotine dependence: Secondary | ICD-10-CM | POA: Insufficient documentation

## 2013-01-24 DIAGNOSIS — S46819A Strain of other muscles, fascia and tendons at shoulder and upper arm level, unspecified arm, initial encounter: Secondary | ICD-10-CM

## 2013-01-24 DIAGNOSIS — S43499A Other sprain of unspecified shoulder joint, initial encounter: Secondary | ICD-10-CM | POA: Insufficient documentation

## 2013-01-24 DIAGNOSIS — I10 Essential (primary) hypertension: Secondary | ICD-10-CM | POA: Insufficient documentation

## 2013-01-24 DIAGNOSIS — Z79899 Other long term (current) drug therapy: Secondary | ICD-10-CM | POA: Insufficient documentation

## 2013-01-24 DIAGNOSIS — Y9389 Activity, other specified: Secondary | ICD-10-CM | POA: Insufficient documentation

## 2013-01-24 DIAGNOSIS — F411 Generalized anxiety disorder: Secondary | ICD-10-CM | POA: Insufficient documentation

## 2013-01-24 DIAGNOSIS — Y92410 Unspecified street and highway as the place of occurrence of the external cause: Secondary | ICD-10-CM | POA: Insufficient documentation

## 2013-01-24 DIAGNOSIS — E119 Type 2 diabetes mellitus without complications: Secondary | ICD-10-CM | POA: Insufficient documentation

## 2013-01-24 DIAGNOSIS — M62838 Other muscle spasm: Secondary | ICD-10-CM

## 2013-01-24 MED ORDER — METHOCARBAMOL 500 MG PO TABS: 500.0000 mg | ORAL_TABLET | Freq: Two times a day (BID) | ORAL | Status: DC

## 2013-01-24 NOTE — ED Provider Notes (Signed)
Medical screening examination/treatment/procedure(s) were performed by non-physician practitioner and as supervising physician I was immediately available for consultation/collaboration.  EKG Interpretation   None         Tryson Lumley, MD 01/24/13 1956 
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
## 2013-01-24 NOTE — Discharge Instructions (Signed)
Take the prescribed medication as directed.  May continue taking 800mg  ibuprofen 3x daily as needed and applying head for added relief. Follow-up with your primary care physician if problems occur. Return to the ED for new or worsening symptoms.
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
## 2013-01-25 NOTE — ED Notes (Signed)
Unable to contact via phone letter sent to [facility] address. 

## 2013-02-02 ENCOUNTER — Ambulatory Visit (HOSPITAL_COMMUNITY)
Admission: RE | Admit: 2013-02-02 | Discharge: 2013-02-02 | Disposition: A | Discharge status: Home / Self Care | Payer: No Typology Code available for payment source | Source: Ambulatory Visit | Attending: Internal Medicine | Admitting: Internal Medicine

## 2013-02-02 ENCOUNTER — Other Ambulatory Visit (HOSPITAL_COMMUNITY): Payer: Self-pay | Admitting: Internal Medicine

## 2013-02-02 DIAGNOSIS — R52 Pain, unspecified: Secondary | ICD-10-CM

## 2013-02-02 DIAGNOSIS — M542 Cervicalgia: Secondary | ICD-10-CM | POA: Insufficient documentation

## 2013-02-02 DIAGNOSIS — M531 Cervicobrachial syndrome: Secondary | ICD-10-CM | POA: Diagnosis not present

## 2013-02-16 ENCOUNTER — Emergency Department (HOSPITAL_COMMUNITY)
Admission: EM | Admit: 2013-02-16 | Discharge: 2013-02-17 | Disposition: A | Discharge status: Home / Self Care | Payer: No Typology Code available for payment source | Attending: Emergency Medicine | Admitting: Emergency Medicine

## 2013-02-16 ENCOUNTER — Encounter (HOSPITAL_COMMUNITY): Payer: Self-pay | Admitting: Emergency Medicine

## 2013-02-16 ENCOUNTER — Emergency Department (HOSPITAL_COMMUNITY): Payer: No Typology Code available for payment source

## 2013-02-16 DIAGNOSIS — M25519 Pain in unspecified shoulder: Secondary | ICD-10-CM | POA: Insufficient documentation

## 2013-02-16 DIAGNOSIS — M542 Cervicalgia: Secondary | ICD-10-CM | POA: Insufficient documentation

## 2013-02-16 DIAGNOSIS — G8911 Acute pain due to trauma: Secondary | ICD-10-CM | POA: Insufficient documentation

## 2013-02-16 DIAGNOSIS — Z87891 Personal history of nicotine dependence: Secondary | ICD-10-CM | POA: Insufficient documentation

## 2013-02-16 DIAGNOSIS — Z794 Long term (current) use of insulin: Secondary | ICD-10-CM | POA: Insufficient documentation

## 2013-02-16 DIAGNOSIS — R110 Nausea: Secondary | ICD-10-CM | POA: Insufficient documentation

## 2013-02-16 DIAGNOSIS — M8530 Osteitis condensans, unspecified site: Secondary | ICD-10-CM | POA: Insufficient documentation

## 2013-02-16 DIAGNOSIS — E119 Type 2 diabetes mellitus without complications: Secondary | ICD-10-CM | POA: Insufficient documentation

## 2013-02-16 DIAGNOSIS — R42 Dizziness and giddiness: Secondary | ICD-10-CM | POA: Insufficient documentation

## 2013-02-16 DIAGNOSIS — F411 Generalized anxiety disorder: Secondary | ICD-10-CM | POA: Insufficient documentation

## 2013-02-16 DIAGNOSIS — Z79899 Other long term (current) drug therapy: Secondary | ICD-10-CM | POA: Insufficient documentation

## 2013-02-16 DIAGNOSIS — I10 Essential (primary) hypertension: Secondary | ICD-10-CM | POA: Insufficient documentation

## 2013-02-16 DIAGNOSIS — R51 Headache: Secondary | ICD-10-CM | POA: Insufficient documentation

## 2013-02-16 DIAGNOSIS — R519 Headache, unspecified: Secondary | ICD-10-CM

## 2013-02-16 MED ORDER — DEXAMETHASONE SODIUM PHOSPHATE 10 MG/ML IJ SOLN
10.0000 mg | Freq: Once | INTRAMUSCULAR | Status: AC
  Administered 2013-02-16: 10 mg via INTRAVENOUS
  Filled 2013-02-16: qty 1

## 2013-02-16 MED ORDER — PROMETHAZINE HCL 25 MG/ML IJ SOLN
25.0000 mg | Freq: Once | INTRAMUSCULAR | Status: AC
  Administered 2013-02-16: 25 mg via INTRAVENOUS
  Filled 2013-02-16: qty 1

## 2013-02-16 MED ORDER — DIPHENHYDRAMINE HCL 50 MG/ML IJ SOLN
25.0000 mg | Freq: Once | INTRAMUSCULAR | Status: AC
  Administered 2013-02-16: 25 mg via INTRAVENOUS
  Filled 2013-02-16: qty 1

## 2013-02-16 MED ORDER — SODIUM CHLORIDE 0.9 % IV BOLUS (SEPSIS)
1000.0000 mL | Freq: Once | INTRAVENOUS | Status: AC
  Administered 2013-02-16: 1000 mL via INTRAVENOUS
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
## 2013-02-16 NOTE — ED Notes (Signed)
Pt c/o headache for 1 1/2 weeks. Pt states pain radiates from back of head to front. Pt states she has had a lot of headaches, but not like this one. Pt states she has been taking Flexeril, Tramadol for pain. Pt states she has nausea and photophobia. Pt alert, no acute distress.
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
## 2013-02-16 NOTE — ED Notes (Signed)
Patient up to bathroom without difficulty--gait steady Patient states that previously given medications have provided pain relief, see MAR Patient now rates headache 3/10 on pain scale Patient denies further needs or complaints at this time Side rails up, call bell in reach

## 2013-02-17 ENCOUNTER — Ambulatory Visit: Payer: No Typology Code available for payment source | Attending: Internal Medicine | Admitting: Physical Therapy

## 2013-02-17 DIAGNOSIS — R293 Abnormal posture: Secondary | ICD-10-CM | POA: Insufficient documentation

## 2013-02-17 DIAGNOSIS — M542 Cervicalgia: Secondary | ICD-10-CM | POA: Diagnosis not present

## 2013-02-17 DIAGNOSIS — R5381 Other malaise: Secondary | ICD-10-CM | POA: Insufficient documentation

## 2013-02-17 DIAGNOSIS — IMO0001 Reserved for inherently not codable concepts without codable children: Secondary | ICD-10-CM | POA: Insufficient documentation

## 2013-02-17 MED ORDER — HYDROCODONE-ACETAMINOPHEN 5-325 MG PO TABS: 1.0000 | ORAL_TABLET | ORAL | Status: DC | PRN

## 2013-02-17 MED ORDER — KETOROLAC TROMETHAMINE 30 MG/ML IJ SOLN
30.0000 mg | Freq: Once | INTRAMUSCULAR | Status: AC
  Administered 2013-02-17: 30 mg via INTRAVENOUS
  Filled 2013-02-17: qty 1

## 2013-02-17 NOTE — Discharge Instructions (Signed)
Migraine Headache A migraine headache is an intense, throbbing pain on one or both sides of your head. A migraine can last for 30 minutes to several hours. CAUSES  The exact cause of a migraine headache is not always known. However, a migraine may be caused when nerves in the brain become irritated and release chemicals that cause inflammation. This causes pain. Certain things may also trigger migraines, such as:  Alcohol.  Smoking.  Stress.  Menstruation.  Aged cheeses.  Foods or drinks that contain nitrates, glutamate, aspartame, or tyramine.  Lack of sleep.  Chocolate.  Caffeine.  Hunger.  Physical exertion.  Fatigue.  Medicines used to treat chest pain (nitroglycerine), birth control pills, estrogen, and some blood pressure medicines. SIGNS AND SYMPTOMS  Pain on one or both sides of your head.  Pulsating or throbbing pain.  Severe pain that prevents daily activities.  Pain that is aggravated by any physical activity.  Nausea, vomiting, or both.  Dizziness.  Pain with exposure to bright lights, loud noises, or activity.  General sensitivity to bright lights, loud noises, or smells. Before you get a migraine, you may get warning signs that a migraine is coming (aura). An aura may include:  Seeing flashing lights.  Seeing bright spots, halos, or zig-zag lines.  Having tunnel vision or blurred vision.  Having feelings of numbness or tingling.  Having trouble talking.  Having muscle weakness. DIAGNOSIS  A migraine headache is often diagnosed based on:  Symptoms.  Physical exam.  A CT scan or MRI of your head. These imaging tests cannot diagnose migraines, but they can help rule out other causes of headaches. TREATMENT Medicines may be given for pain and nausea. Medicines can also be given to help prevent recurrent migraines.  HOME CARE INSTRUCTIONS  Only take over-the-counter or prescription medicines for pain or discomfort as directed by your  health care provider. The use of long-term narcotics is not recommended.  Lie down in a dark, quiet room when you have a migraine.  Keep a journal to find out what may trigger your migraine headaches. For example, write down:  What you eat and drink.  How much sleep you get.  Any change to your diet or medicines.  Limit alcohol consumption.  Quit smoking if you smoke.  Get 7 9 hours of sleep, or as recommended by your health care provider.  Limit stress.  Keep lights dim if bright lights bother you and make your migraines worse. SEEK IMMEDIATE MEDICAL CARE IF:   Your migraine becomes severe.  You have a fever.  You have a stiff neck.  You have vision loss.  You have muscular weakness or loss of muscle control.  You start losing your balance or have trouble walking.  You feel faint or pass out.  You have severe symptoms that are different from your first symptoms. MAKE SURE YOU:   Understand these instructions.  Will watch your condition.  Will get help right away if you are not doing well or get worse. Document Released: 01/06/2005 Document Revised: 10/27/2012 Document Reviewed: 09/13/2012 [employer] Patient Information 2014 [employer].  

## 2013-02-17 NOTE — ED Provider Notes (Signed)
Medical screening examination/treatment/procedure(s) were performed by non-physician practitioner and as supervising physician I was immediately available for consultation/collaboration.  EKG Interpretation   None         Junius ArgyleForrest S Alexande Sheerin, MD 02/17/13 1132

## 2013-02-21 ENCOUNTER — Ambulatory Visit: Payer: No Typology Code available for payment source | Attending: Internal Medicine | Admitting: Physical Therapy

## 2013-02-21 DIAGNOSIS — R5381 Other malaise: Secondary | ICD-10-CM | POA: Insufficient documentation

## 2013-02-21 DIAGNOSIS — IMO0001 Reserved for inherently not codable concepts without codable children: Secondary | ICD-10-CM | POA: Diagnosis present

## 2013-02-21 DIAGNOSIS — R293 Abnormal posture: Secondary | ICD-10-CM | POA: Insufficient documentation

## 2013-02-21 DIAGNOSIS — M542 Cervicalgia: Secondary | ICD-10-CM | POA: Insufficient documentation

## 2013-02-24 ENCOUNTER — Ambulatory Visit: Payer: No Typology Code available for payment source | Admitting: Physical Therapy

## 2013-02-24 DIAGNOSIS — IMO0001 Reserved for inherently not codable concepts without codable children: Secondary | ICD-10-CM | POA: Diagnosis not present

## 2013-02-28 ENCOUNTER — Ambulatory Visit: Payer: No Typology Code available for payment source | Admitting: Physical Therapy

## 2013-02-28 DIAGNOSIS — IMO0001 Reserved for inherently not codable concepts without codable children: Secondary | ICD-10-CM | POA: Diagnosis not present

## 2013-03-01 ENCOUNTER — Ambulatory Visit: Payer: No Typology Code available for payment source | Admitting: Physical Therapy

## 2013-03-01 DIAGNOSIS — IMO0001 Reserved for inherently not codable concepts without codable children: Secondary | ICD-10-CM | POA: Diagnosis not present

## 2013-03-07 ENCOUNTER — Encounter: Payer: Self-pay | Admitting: Neurology

## 2013-03-08 ENCOUNTER — Encounter: Payer: Medicaid Other | Admitting: Physical Therapy

## 2013-03-09 ENCOUNTER — Ambulatory Visit (INDEPENDENT_AMBULATORY_CARE_PROVIDER_SITE_OTHER): Payer: Medicaid Other | Admitting: Neurology

## 2013-03-09 ENCOUNTER — Encounter: Payer: Self-pay | Admitting: Neurology

## 2013-03-09 ENCOUNTER — Ambulatory Visit: Payer: No Typology Code available for payment source | Admitting: Physical Therapy

## 2013-03-09 VITALS — BP 136/91 | HR 90 | Ht 68.0 in | Wt 179.0 lb

## 2013-03-09 DIAGNOSIS — G4486 Cervicogenic headache: Secondary | ICD-10-CM

## 2013-03-09 DIAGNOSIS — R51 Headache: Secondary | ICD-10-CM

## 2013-03-09 DIAGNOSIS — IMO0001 Reserved for inherently not codable concepts without codable children: Secondary | ICD-10-CM | POA: Diagnosis not present

## 2013-03-09 HISTORY — DX: Cervicogenic headache: G44.86

## 2013-03-09 NOTE — Patient Instructions (Signed)
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
## 2013-03-15 ENCOUNTER — Ambulatory Visit: Payer: No Typology Code available for payment source | Admitting: Physical Therapy

## 2013-03-15 ENCOUNTER — Telehealth: Payer: Self-pay | Admitting: Neurology

## 2013-03-15 ENCOUNTER — Ambulatory Visit
Admission: RE | Admit: 2013-03-15 | Discharge: 2013-03-15 | Disposition: A | Discharge status: Home / Self Care | Payer: Medicaid Other | Source: Ambulatory Visit | Attending: Neurology | Admitting: Neurology

## 2013-03-15 DIAGNOSIS — G4486 Cervicogenic headache: Secondary | ICD-10-CM

## 2013-03-15 DIAGNOSIS — R51 Headache: Secondary | ICD-10-CM

## 2013-03-15 DIAGNOSIS — IMO0001 Reserved for inherently not codable concepts without codable children: Secondary | ICD-10-CM | POA: Diagnosis not present

## 2013-03-15 NOTE — Telephone Encounter (Signed)
I called patient. MRI of the cervical spine is relatively unremarkable. The spine appears to be somewhat straight, and may be consistent with spasm. The patient will need continued physical therapy and the medications. The patient will followup through this office if needed.   MRI cervical spine 03/15/2013:  IMPRESSION:  Mildly abnormal MRI cervical spine (without) demonstrating: 1. Multi-level disc bulging and spondylosis, with anterior greater than posterior disc complexes, with largest at C4-5 level anteriorly.  2. No spinal stenosis or foraminal narrowing.

## 2013-03-16 ENCOUNTER — Other Ambulatory Visit: Payer: Medicaid Other

## 2013-03-16 ENCOUNTER — Ambulatory Visit: Payer: Medicaid Other | Admitting: Neurology

## 2013-03-17 ENCOUNTER — Encounter: Payer: Medicaid Other | Admitting: Physical Therapy

## 2013-03-22 ENCOUNTER — Ambulatory Visit: Payer: No Typology Code available for payment source | Attending: Internal Medicine | Admitting: Rehabilitation

## 2013-03-22 DIAGNOSIS — R5381 Other malaise: Secondary | ICD-10-CM | POA: Insufficient documentation

## 2013-03-22 DIAGNOSIS — R293 Abnormal posture: Secondary | ICD-10-CM | POA: Insufficient documentation

## 2013-03-22 DIAGNOSIS — IMO0001 Reserved for inherently not codable concepts without codable children: Secondary | ICD-10-CM | POA: Insufficient documentation

## 2013-03-22 DIAGNOSIS — M542 Cervicalgia: Secondary | ICD-10-CM | POA: Insufficient documentation

## 2013-03-24 ENCOUNTER — Telehealth: Payer: Self-pay | Admitting: Neurology

## 2013-03-24 ENCOUNTER — Ambulatory Visit: Payer: No Typology Code available for payment source | Admitting: Physical Therapy

## 2013-03-24 NOTE — Telephone Encounter (Signed)
I did not bite the original order for the physical therapy for neuromuscular therapy. I will however, write order for this procedure.

## 2013-03-24 NOTE — Telephone Encounter (Signed)
Tresa EndoKelly a PT [facility] asked that the doctor write on a prescription pad an order for Trigger point dry needling and have it faxed to [facility].  They are not going to have the service for the next month.   I will fax the order to [phone].

## 2013-04-05 ENCOUNTER — Ambulatory Visit: Payer: No Typology Code available for payment source | Admitting: Physical Therapy

## 2013-04-07 ENCOUNTER — Encounter: Payer: Medicaid Other | Admitting: Physical Therapy

## 2013-04-12 ENCOUNTER — Encounter: Payer: Medicaid Other | Admitting: Physical Therapy

## 2013-04-14 ENCOUNTER — Encounter: Payer: Medicaid Other | Admitting: Physical Therapy

## 2013-07-14 ENCOUNTER — Other Ambulatory Visit: Payer: Self-pay | Admitting: Orthopedic Surgery

## 2013-07-14 DIAGNOSIS — M19029 Primary osteoarthritis, unspecified elbow: Secondary | ICD-10-CM

## 2013-07-15 ENCOUNTER — Other Ambulatory Visit: Payer: Self-pay | Admitting: Orthopedic Surgery

## 2013-07-15 ENCOUNTER — Other Ambulatory Visit: Payer: Medicaid Other

## 2013-07-15 ENCOUNTER — Ambulatory Visit
Admission: RE | Admit: 2013-07-15 | Discharge: 2013-07-15 | Disposition: A | Discharge status: Home / Self Care | Payer: Medicaid Other | Source: Ambulatory Visit | Attending: Orthopedic Surgery | Admitting: Orthopedic Surgery

## 2013-07-15 DIAGNOSIS — M19029 Primary osteoarthritis, unspecified elbow: Secondary | ICD-10-CM

## 2013-08-02 ENCOUNTER — Ambulatory Visit: Payer: Medicaid Other | Attending: Orthopedic Surgery | Admitting: Occupational Therapy

## 2013-08-02 DIAGNOSIS — IMO0001 Reserved for inherently not codable concepts without codable children: Secondary | ICD-10-CM | POA: Diagnosis present

## 2013-08-02 DIAGNOSIS — M2560 Stiffness of unspecified joint, not elsewhere classified: Secondary | ICD-10-CM | POA: Diagnosis not present

## 2013-08-02 DIAGNOSIS — Z9889 Other specified postprocedural states: Secondary | ICD-10-CM | POA: Insufficient documentation

## 2013-08-02 DIAGNOSIS — M2550 Pain in unspecified joint: Secondary | ICD-10-CM | POA: Insufficient documentation

## 2013-08-18 ENCOUNTER — Ambulatory Visit: Payer: Medicaid Other | Admitting: Occupational Therapy

## 2013-08-18 DIAGNOSIS — IMO0001 Reserved for inherently not codable concepts without codable children: Secondary | ICD-10-CM | POA: Diagnosis not present

## 2013-08-22 ENCOUNTER — Ambulatory Visit: Payer: Medicaid Other | Attending: Orthopedic Surgery | Admitting: Occupational Therapy

## 2013-08-22 DIAGNOSIS — Z9889 Other specified postprocedural states: Secondary | ICD-10-CM | POA: Insufficient documentation

## 2013-08-22 DIAGNOSIS — M2560 Stiffness of unspecified joint, not elsewhere classified: Secondary | ICD-10-CM | POA: Diagnosis not present

## 2013-08-22 DIAGNOSIS — IMO0001 Reserved for inherently not codable concepts without codable children: Secondary | ICD-10-CM | POA: Insufficient documentation

## 2013-08-22 DIAGNOSIS — M2550 Pain in unspecified joint: Secondary | ICD-10-CM | POA: Insufficient documentation

## 2013-08-29 ENCOUNTER — Ambulatory Visit: Payer: Medicaid Other | Admitting: Occupational Therapy

## 2013-08-30 ENCOUNTER — Ambulatory Visit: Payer: Medicaid Other | Admitting: Occupational Therapy

## 2013-08-30 DIAGNOSIS — IMO0001 Reserved for inherently not codable concepts without codable children: Secondary | ICD-10-CM | POA: Diagnosis not present

## 2015-06-18 ENCOUNTER — Inpatient Hospital Stay (HOSPITAL_COMMUNITY)
Admission: EM | Admit: 2015-06-18 | Discharge: 2015-06-22 | DRG: 493 | Disposition: A | Discharge status: Home / Self Care | Payer: Medicare Other | Attending: General Surgery | Admitting: General Surgery

## 2015-06-18 ENCOUNTER — Inpatient Hospital Stay (HOSPITAL_COMMUNITY): Payer: Medicare Other | Admitting: Anesthesiology

## 2015-06-18 ENCOUNTER — Emergency Department (HOSPITAL_COMMUNITY): Payer: Medicare Other

## 2015-06-18 ENCOUNTER — Encounter (HOSPITAL_COMMUNITY): Admission: EM | Disposition: A | Discharge status: Home / Self Care | Payer: Self-pay | Source: Home / Self Care

## 2015-06-18 ENCOUNTER — Inpatient Hospital Stay (HOSPITAL_COMMUNITY): Payer: Medicare Other

## 2015-06-18 ENCOUNTER — Encounter (HOSPITAL_COMMUNITY): Payer: Self-pay | Admitting: Emergency Medicine

## 2015-06-18 DIAGNOSIS — S62606A Fracture of unspecified phalanx of right little finger, initial encounter for closed fracture: Secondary | ICD-10-CM | POA: Diagnosis present

## 2015-06-18 DIAGNOSIS — E119 Type 2 diabetes mellitus without complications: Secondary | ICD-10-CM | POA: Diagnosis present

## 2015-06-18 DIAGNOSIS — F430 Acute stress reaction: Secondary | ICD-10-CM | POA: Diagnosis present

## 2015-06-18 DIAGNOSIS — W3400XA Accidental discharge from unspecified firearms or gun, initial encounter: Secondary | ICD-10-CM

## 2015-06-18 DIAGNOSIS — D72829 Elevated white blood cell count, unspecified: Secondary | ICD-10-CM | POA: Diagnosis not present

## 2015-06-18 DIAGNOSIS — S61216A Laceration without foreign body of right little finger without damage to nail, initial encounter: Secondary | ICD-10-CM | POA: Diagnosis present

## 2015-06-18 DIAGNOSIS — T148XXA Other injury of unspecified body region, initial encounter: Secondary | ICD-10-CM

## 2015-06-18 DIAGNOSIS — S82202A Unspecified fracture of shaft of left tibia, initial encounter for closed fracture: Secondary | ICD-10-CM | POA: Diagnosis present

## 2015-06-18 DIAGNOSIS — Z87891 Personal history of nicotine dependence: Secondary | ICD-10-CM | POA: Diagnosis not present

## 2015-06-18 DIAGNOSIS — I10 Essential (primary) hypertension: Secondary | ICD-10-CM | POA: Diagnosis present

## 2015-06-18 DIAGNOSIS — Z419 Encounter for procedure for purposes other than remedying health state, unspecified: Secondary | ICD-10-CM

## 2015-06-18 DIAGNOSIS — S62604A Fracture of unspecified phalanx of right ring finger, initial encounter for closed fracture: Secondary | ICD-10-CM | POA: Diagnosis present

## 2015-06-18 DIAGNOSIS — S82292B Other fracture of shaft of left tibia, initial encounter for open fracture type I or II: Secondary | ICD-10-CM | POA: Diagnosis present

## 2015-06-18 DIAGNOSIS — F419 Anxiety disorder, unspecified: Secondary | ICD-10-CM | POA: Diagnosis present

## 2015-06-18 DIAGNOSIS — Y249XXA Unspecified firearm discharge, undetermined intent, initial encounter: Secondary | ICD-10-CM

## 2015-06-18 DIAGNOSIS — D62 Acute posthemorrhagic anemia: Secondary | ICD-10-CM | POA: Diagnosis not present

## 2015-06-18 DIAGNOSIS — T148 Other injury of unspecified body region: Secondary | ICD-10-CM | POA: Diagnosis present

## 2015-06-18 HISTORY — PX: TIBIA IM NAIL INSERTION: SHX2516

## 2015-06-18 HISTORY — DX: Type 2 diabetes mellitus without complications: E11.9

## 2015-06-18 HISTORY — PX: IM NAILING TIBIA: SUR734

## 2015-06-18 LAB — COMPREHENSIVE METABOLIC PANEL WITH GFR
ALT: 15 U/L (ref 14–54)
AST: 20 U/L (ref 15–41)
Albumin: 3.7 g/dL (ref 3.5–5.0)
Alkaline Phosphatase: 37 U/L — ABNORMAL LOW (ref 38–126)
Anion gap: 9 (ref 5–15)
BUN: 7 mg/dL (ref 6–20)
CO2: 22 mmol/L (ref 22–32)
Calcium: 9.2 mg/dL (ref 8.9–10.3)
Chloride: 107 mmol/L (ref 101–111)
Creatinine, Ser: 1.16 mg/dL — ABNORMAL HIGH (ref 0.44–1.00)
GFR calc Af Amer: >60 mL/min
GFR calc non Af Amer: 56 mL/min — ABNORMAL LOW
Glucose, Bld: 198 mg/dL — ABNORMAL HIGH (ref 65–99)
Potassium: 3.5 mmol/L (ref 3.5–5.1)
Sodium: 138 mmol/L (ref 135–145)
Total Bilirubin: 0.1 mg/dL — ABNORMAL LOW (ref 0.3–1.2)
Total Protein: 6.9 g/dL (ref 6.5–8.1)

## 2015-06-18 LAB — PROTIME-INR
INR: 1.07 (ref 0.00–1.49)
Prothrombin Time: 14.1 s (ref 11.6–15.2)

## 2015-06-18 LAB — CBC
HEMATOCRIT: 40.4 % (ref 36.0–46.0)
HEMOGLOBIN: 12.9 g/dL (ref 12.0–15.0)
MCH: 27.1 pg (ref 26.0–34.0)
MCHC: 31.9 g/dL (ref 30.0–36.0)
MCV: 84.9 fL (ref 78.0–100.0)
Platelets: 260 10*3/uL (ref 150–400)
RBC: 4.76 MIL/uL (ref 3.87–5.11)
RDW: 13.3 % (ref 11.5–15.5)
WBC: 13.9 10*3/uL — ABNORMAL HIGH (ref 4.0–10.5)

## 2015-06-18 LAB — I-STAT CHEM 8, ED
BUN: 8 mg/dL (ref 6–20)
CALCIUM ION: 1.17 mmol/L (ref 1.12–1.23)
Chloride: 105 mmol/L (ref 101–111)
Creatinine, Ser: 1.1 mg/dL — ABNORMAL HIGH (ref 0.44–1.00)
Glucose, Bld: 199 mg/dL — ABNORMAL HIGH (ref 65–99)
HCT: 44 % (ref 36.0–46.0)
Hemoglobin: 15 g/dL (ref 12.0–15.0)
Potassium: 3.5 mmol/L (ref 3.5–5.1)
SODIUM: 142 mmol/L (ref 135–145)
TCO2: 21 mmol/L (ref 0–100)

## 2015-06-18 LAB — I-STAT CG4 LACTIC ACID, ED: LACTIC ACID, VENOUS: 3.35 mmol/L — AB (ref 0.5–2.0)

## 2015-06-18 LAB — GLUCOSE, CAPILLARY
Glucose-Capillary: 132 mg/dL — ABNORMAL HIGH (ref 65–99)
Glucose-Capillary: 226 mg/dL — ABNORMAL HIGH (ref 65–99)

## 2015-06-18 LAB — TYPE AND SCREEN
ABO/RH(D): AB NEG
Antibody Screen: NEGATIVE
Unit division: 0
Unit division: 0

## 2015-06-18 LAB — I-STAT BETA HCG BLOOD, ED (MC, WL, AP ONLY): I-stat hCG, quantitative: <5 m[IU]/mL

## 2015-06-18 LAB — PREPARE FRESH FROZEN PLASMA
Unit division: 0
Unit division: 0

## 2015-06-18 LAB — CDS SEROLOGY

## 2015-06-18 LAB — BLOOD PRODUCT ORDER (VERBAL) VERIFICATION

## 2015-06-18 LAB — ETHANOL: Alcohol, Ethyl (B): 11 mg/dL — ABNORMAL HIGH (ref <5)

## 2015-06-18 LAB — ABO/RH: ABO/RH(D): AB NEG

## 2015-06-18 SURGERY — INSERTION, INTRAMEDULLARY ROD, TIBIA
Anesthesia: General | Site: Leg Lower | Laterality: Left

## 2015-06-18 MED ORDER — DEXAMETHASONE SODIUM PHOSPHATE 10 MG/ML IJ SOLN
INTRAMUSCULAR | Status: AC
  Filled 2015-06-18: qty 1

## 2015-06-18 MED ORDER — POTASSIUM CHLORIDE IN NACL 20-0.9 MEQ/L-% IV SOLN
INTRAVENOUS | Status: DC
  Administered 2015-06-18: 17:00:00 via INTRAVENOUS
  Filled 2015-06-18 (×2): qty 1000

## 2015-06-18 MED ORDER — CEFAZOLIN SODIUM-DEXTROSE 2-4 GM/100ML-% IV SOLN
INTRAVENOUS | Status: AC
  Filled 2015-06-18: qty 100

## 2015-06-18 MED ORDER — NEOSTIGMINE METHYLSULFATE 10 MG/10ML IV SOLN
INTRAVENOUS | Status: DC | PRN
  Administered 2015-06-18: 3 mg via INTRAVENOUS

## 2015-06-18 MED ORDER — 0.9 % SODIUM CHLORIDE (POUR BTL) OPTIME
TOPICAL | Status: DC | PRN
  Administered 2015-06-18: 1000 mL

## 2015-06-18 MED ORDER — LIDOCAINE HCL (PF) 1 % IJ SOLN
5.0000 mL | Freq: Once | INTRAMUSCULAR | Status: DC
  Filled 2015-06-18: qty 5

## 2015-06-18 MED ORDER — FENTANYL CITRATE (PF) 100 MCG/2ML IJ SOLN
100.0000 ug | INTRAMUSCULAR | Status: DC | PRN
  Administered 2015-06-18: 100 ug via INTRAVENOUS
  Filled 2015-06-18: qty 2

## 2015-06-18 MED ORDER — HYDROMORPHONE HCL 1 MG/ML IJ SOLN
INTRAMUSCULAR | Status: AC
  Administered 2015-06-18: 0.5 mg via INTRAVENOUS
  Filled 2015-06-18: qty 1

## 2015-06-18 MED ORDER — PROPOFOL 10 MG/ML IV BOLUS
INTRAVENOUS | Status: DC | PRN
  Administered 2015-06-18: 150 mg via INTRAVENOUS

## 2015-06-18 MED ORDER — PANTOPRAZOLE SODIUM 40 MG PO TBEC
40.0000 mg | DELAYED_RELEASE_TABLET | Freq: Every day | ORAL | Status: DC
  Administered 2015-06-19 – 2015-06-22 (×4): 40 mg via ORAL
  Filled 2015-06-18 (×4): qty 1

## 2015-06-18 MED ORDER — MIDAZOLAM HCL 2 MG/2ML IJ SOLN
INTRAMUSCULAR | Status: AC
  Filled 2015-06-18: qty 2

## 2015-06-18 MED ORDER — ENOXAPARIN SODIUM 40 MG/0.4ML ~~LOC~~ SOLN: 40.0000 mg | SUBCUTANEOUS | Status: DC

## 2015-06-18 MED ORDER — MEPERIDINE HCL 25 MG/ML IJ SOLN: 6.2500 mg | INTRAMUSCULAR | Status: DC | PRN

## 2015-06-18 MED ORDER — DEXAMETHASONE SODIUM PHOSPHATE 10 MG/ML IJ SOLN
INTRAMUSCULAR | Status: DC | PRN
  Administered 2015-06-18: 5 mg via INTRAVENOUS

## 2015-06-18 MED ORDER — FENTANYL CITRATE (PF) 100 MCG/2ML IJ SOLN
INTRAMUSCULAR | Status: DC | PRN
  Administered 2015-06-18: 50 ug via INTRAVENOUS
  Administered 2015-06-18: 200 ug via INTRAVENOUS
  Administered 2015-06-18: 50 ug via INTRAVENOUS

## 2015-06-18 MED ORDER — PHENYLEPHRINE 40 MCG/ML (10ML) SYRINGE FOR IV PUSH (FOR BLOOD PRESSURE SUPPORT)
PREFILLED_SYRINGE | INTRAVENOUS | Status: AC
  Filled 2015-06-18: qty 10

## 2015-06-18 MED ORDER — PROPOFOL 10 MG/ML IV BOLUS
INTRAVENOUS | Status: AC
  Filled 2015-06-18: qty 20

## 2015-06-18 MED ORDER — ONDANSETRON HCL 4 MG/2ML IJ SOLN: 4.0000 mg | Freq: Four times a day (QID) | INTRAMUSCULAR | Status: DC | PRN

## 2015-06-18 MED ORDER — SODIUM CHLORIDE 0.9 % IR SOLN
Status: DC | PRN
  Administered 2015-06-18: 3000 mL

## 2015-06-18 MED ORDER — FENTANYL CITRATE (PF) 250 MCG/5ML IJ SOLN
INTRAMUSCULAR | Status: AC
  Filled 2015-06-18: qty 5

## 2015-06-18 MED ORDER — CEFAZOLIN SODIUM 1 G IJ SOLR
INTRAMUSCULAR | Status: DC | PRN
  Administered 2015-06-18: 2 g via INTRAMUSCULAR

## 2015-06-18 MED ORDER — FENTANYL CITRATE (PF) 100 MCG/2ML IJ SOLN
INTRAMUSCULAR | Status: AC
  Filled 2015-06-18: qty 2

## 2015-06-18 MED ORDER — ACETAMINOPHEN 325 MG PO TABS
650.0000 mg | ORAL_TABLET | ORAL | Status: DC | PRN
Start: 2015-06-18 — End: 2015-06-19

## 2015-06-18 MED ORDER — MIDAZOLAM HCL 5 MG/5ML IJ SOLN
INTRAMUSCULAR | Status: DC | PRN
  Administered 2015-06-18 (×2): 1 mg via INTRAVENOUS

## 2015-06-18 MED ORDER — OXYCODONE HCL 5 MG PO TABS
10.0000 mg | ORAL_TABLET | ORAL | Status: DC | PRN
  Administered 2015-06-18 – 2015-06-19 (×3): 10 mg via ORAL
  Filled 2015-06-18 (×2): qty 2

## 2015-06-18 MED ORDER — HYDROMORPHONE HCL 1 MG/ML IJ SOLN
0.2500 mg | INTRAMUSCULAR | Status: DC | PRN
  Administered 2015-06-18 (×4): 0.5 mg via INTRAVENOUS

## 2015-06-18 MED ORDER — FENTANYL CITRATE (PF) 100 MCG/2ML IJ SOLN
INTRAMUSCULAR | Status: AC | PRN
Start: 2015-06-18 — End: 2015-06-18
  Administered 2015-06-18: 50 ug via INTRAVENOUS

## 2015-06-18 MED ORDER — FENTANYL CITRATE (PF) 100 MCG/2ML IJ SOLN
100.0000 ug | Freq: Once | INTRAMUSCULAR | Status: AC
  Administered 2015-06-18: 100 ug via INTRAVENOUS

## 2015-06-18 MED ORDER — ONDANSETRON HCL 4 MG PO TABS: 4.0000 mg | ORAL_TABLET | Freq: Four times a day (QID) | ORAL | Status: DC | PRN

## 2015-06-18 MED ORDER — DIPHENHYDRAMINE HCL 50 MG/ML IJ SOLN
INTRAMUSCULAR | Status: DC | PRN
  Administered 2015-06-18: 12.5 mg via INTRAVENOUS

## 2015-06-18 MED ORDER — PANTOPRAZOLE SODIUM 40 MG IV SOLR: 40.0000 mg | Freq: Every day | INTRAVENOUS | Status: DC

## 2015-06-18 MED ORDER — CEFAZOLIN SODIUM-DEXTROSE 2-4 GM/100ML-% IV SOLN
2.0000 g | Freq: Once | INTRAVENOUS | Status: AC
  Administered 2015-06-18: 2 g via INTRAVENOUS

## 2015-06-18 MED ORDER — LACTATED RINGERS IV SOLN
INTRAVENOUS | Status: DC | PRN
  Administered 2015-06-18 (×2): via INTRAVENOUS

## 2015-06-18 MED ORDER — GLYCOPYRROLATE 0.2 MG/ML IJ SOLN
INTRAMUSCULAR | Status: DC | PRN
  Administered 2015-06-18: 0.4 mg via INTRAVENOUS

## 2015-06-18 MED ORDER — ONDANSETRON HCL 4 MG/2ML IJ SOLN
INTRAMUSCULAR | Status: DC | PRN
  Administered 2015-06-18: 4 mg via INTRAVENOUS

## 2015-06-18 MED ORDER — OXYCODONE HCL 5 MG PO TABS
ORAL_TABLET | ORAL | Status: AC
  Administered 2015-06-18: 10 mg via ORAL
  Filled 2015-06-18: qty 2

## 2015-06-18 MED ORDER — MORPHINE SULFATE (PF) 4 MG/ML IV SOLN
INTRAVENOUS | Status: AC
  Filled 2015-06-18: qty 1

## 2015-06-18 MED ORDER — LORAZEPAM 2 MG/ML IJ SOLN
INTRAMUSCULAR | Status: AC
  Filled 2015-06-18: qty 1

## 2015-06-18 MED ORDER — CEFAZOLIN SODIUM 1 G IJ SOLR
INTRAMUSCULAR | Status: AC
  Filled 2015-06-18: qty 20

## 2015-06-18 MED ORDER — CEFAZOLIN SODIUM-DEXTROSE 2-4 GM/100ML-% IV SOLN
2.0000 g | Freq: Four times a day (QID) | INTRAVENOUS | Status: AC
  Administered 2015-06-18 – 2015-06-19 (×3): 2 g via INTRAVENOUS
  Filled 2015-06-18 (×3): qty 100

## 2015-06-18 MED ORDER — ROCURONIUM BROMIDE 50 MG/5ML IV SOLN
INTRAVENOUS | Status: AC
  Filled 2015-06-18: qty 1

## 2015-06-18 MED ORDER — TETANUS-DIPHTH-ACELL PERTUSSIS 5-2.5-18.5 LF-MCG/0.5 IM SUSP
INTRAMUSCULAR | Status: AC
  Filled 2015-06-18: qty 0.5

## 2015-06-18 MED ORDER — METOCLOPRAMIDE HCL 5 MG/ML IJ SOLN: 5.0000 mg | Freq: Three times a day (TID) | INTRAMUSCULAR | Status: DC | PRN

## 2015-06-18 MED ORDER — OXYCODONE HCL 5 MG PO TABS
5.0000 mg | ORAL_TABLET | ORAL | Status: DC | PRN
  Administered 2015-06-18: 5 mg via ORAL
  Filled 2015-06-18: qty 1

## 2015-06-18 MED ORDER — ACETAMINOPHEN 650 MG RE SUPP: 650.0000 mg | Freq: Four times a day (QID) | RECTAL | Status: DC | PRN

## 2015-06-18 MED ORDER — ACETAMINOPHEN 325 MG PO TABS
650.0000 mg | ORAL_TABLET | Freq: Four times a day (QID) | ORAL | Status: DC | PRN
  Administered 2015-06-20: 650 mg via ORAL
  Filled 2015-06-18: qty 2

## 2015-06-18 MED ORDER — INSULIN ASPART 100 UNIT/ML ~~LOC~~ SOLN
0.0000 [IU] | Freq: Three times a day (TID) | SUBCUTANEOUS | Status: DC
  Administered 2015-06-18: 5 [IU] via SUBCUTANEOUS
  Administered 2015-06-19 (×3): 3 [IU] via SUBCUTANEOUS
  Administered 2015-06-20: 2 [IU] via SUBCUTANEOUS
  Administered 2015-06-20 – 2015-06-21 (×2): 3 [IU] via SUBCUTANEOUS
  Administered 2015-06-21: 2 [IU] via SUBCUTANEOUS
  Administered 2015-06-21: 3 [IU] via SUBCUTANEOUS
  Administered 2015-06-22: 2 [IU] via SUBCUTANEOUS
  Administered 2015-06-22: 3 [IU] via SUBCUTANEOUS

## 2015-06-18 MED ORDER — TETANUS-DIPHTH-ACELL PERTUSSIS 5-2.5-18.5 LF-MCG/0.5 IM SUSP
0.5000 mL | Freq: Once | INTRAMUSCULAR | Status: AC
  Administered 2015-06-18: 0.5 mL via INTRAMUSCULAR

## 2015-06-18 MED ORDER — LIDOCAINE HCL (CARDIAC) 20 MG/ML IV SOLN
INTRAVENOUS | Status: DC | PRN
  Administered 2015-06-18: 20 mg via INTRAVENOUS

## 2015-06-18 MED ORDER — LORAZEPAM 2 MG/ML IJ SOLN
INTRAMUSCULAR | Status: AC | PRN
  Administered 2015-06-18: 1 mg via INTRAVENOUS

## 2015-06-18 MED ORDER — ENOXAPARIN SODIUM 40 MG/0.4ML ~~LOC~~ SOLN
40.0000 mg | SUBCUTANEOUS | Status: DC
  Administered 2015-06-19 – 2015-06-22 (×4): 40 mg via SUBCUTANEOUS
  Filled 2015-06-18 (×4): qty 0.4

## 2015-06-18 MED ORDER — METOCLOPRAMIDE HCL 5 MG PO TABS: 5.0000 mg | ORAL_TABLET | Freq: Three times a day (TID) | ORAL | Status: DC | PRN

## 2015-06-18 MED ORDER — MORPHINE SULFATE (PF) 2 MG/ML IV SOLN
1.0000 mg | INTRAVENOUS | Status: DC | PRN
  Administered 2015-06-18 (×3): 4 mg via INTRAVENOUS
  Administered 2015-06-19: 2 mg via INTRAVENOUS
  Filled 2015-06-18: qty 1
  Filled 2015-06-18 (×2): qty 2

## 2015-06-18 MED ORDER — ALPRAZOLAM 0.5 MG PO TABS
1.0000 mg | ORAL_TABLET | Freq: Two times a day (BID) | ORAL | Status: DC | PRN
  Administered 2015-06-18: 1 mg via ORAL
  Filled 2015-06-18: qty 2

## 2015-06-18 MED ORDER — ONDANSETRON HCL 4 MG/2ML IJ SOLN
INTRAMUSCULAR | Status: AC
  Filled 2015-06-18: qty 2

## 2015-06-18 MED ORDER — MIDAZOLAM HCL 2 MG/2ML IJ SOLN
0.5000 mg | Freq: Once | INTRAMUSCULAR | Status: AC | PRN
  Administered 2015-06-18: 2 mg via INTRAVENOUS

## 2015-06-18 MED ORDER — DIPHENHYDRAMINE HCL 50 MG/ML IJ SOLN
INTRAMUSCULAR | Status: AC
  Filled 2015-06-18: qty 1

## 2015-06-18 MED ORDER — ROCURONIUM BROMIDE 100 MG/10ML IV SOLN
INTRAVENOUS | Status: DC | PRN
  Administered 2015-06-18: 50 mg via INTRAVENOUS

## 2015-06-18 SURGICAL SUPPLY — 71 items
BANDAGE ELASTIC 4 VELCRO ST LF (GAUZE/BANDAGES/DRESSINGS) ×1 IMPLANT
BANDAGE ELASTIC 6 VELCRO ST LF (GAUZE/BANDAGES/DRESSINGS) ×3 IMPLANT
BIT DRILL AO GAMMA 4.2X130 (BIT) ×2 IMPLANT
BIT DRILL AO GAMMA 4.2X340 (BIT) ×2 IMPLANT
BLADE SURG 10 STRL SS (BLADE) ×3 IMPLANT
BNDG COHESIVE 4X5 TAN STRL (GAUZE/BANDAGES/DRESSINGS) ×1 IMPLANT
BNDG GAUZE ELAST 4 BULKY (GAUZE/BANDAGES/DRESSINGS) ×3 IMPLANT
CANISTER WOUND CARE 500ML ATS (WOUND CARE) ×2 IMPLANT
CHLORAPREP W/TINT 26ML (MISCELLANEOUS) ×5 IMPLANT
COVER MAYO STAND STRL (DRAPES) ×3 IMPLANT
COVER SURGICAL LIGHT HANDLE (MISCELLANEOUS) ×2 IMPLANT
CUFF TOURNIQUET SINGLE 34IN LL (TOURNIQUET CUFF) ×2 IMPLANT
CUFF TOURNIQUET SINGLE 44IN (TOURNIQUET CUFF) IMPLANT
DRAPE C-ARM 42X72 X-RAY (DRAPES) ×3 IMPLANT
DRAPE C-ARMOR (DRAPES) ×2 IMPLANT
DRAPE IMP U-DRAPE 54X76 (DRAPES) ×3 IMPLANT
DRAPE PROXIMA HALF (DRAPES) ×3 IMPLANT
DRESSING OPSITE X SMALL 2X3 (GAUZE/BANDAGES/DRESSINGS) ×1 IMPLANT
DRSG ADAPTIC 3X8 NADH LF (GAUZE/BANDAGES/DRESSINGS) ×1 IMPLANT
DRSG TEGADERM 4X4.75 (GAUZE/BANDAGES/DRESSINGS) ×2 IMPLANT
DRSG VAC ATS SM SENSATRAC (GAUZE/BANDAGES/DRESSINGS) ×2 IMPLANT
ELECT CAUTERY BLADE 6.4 (BLADE) ×3 IMPLANT
ELECT REM PT RETURN 9FT ADLT (ELECTROSURGICAL) ×3
ELECTRODE REM PT RTRN 9FT ADLT (ELECTROSURGICAL) ×1 IMPLANT
GAUZE SPONGE 4X4 12PLY STRL (GAUZE/BANDAGES/DRESSINGS) ×3 IMPLANT
GAUZE XEROFORM 1X8 LF (GAUZE/BANDAGES/DRESSINGS) ×2 IMPLANT
GLOVE BIO SURGEON STRL SZ7 (GLOVE) ×3 IMPLANT
GLOVE BIO SURGEON STRL SZ7.5 (GLOVE) ×6 IMPLANT
GLOVE BIO SURGEON STRL SZ8.5 (GLOVE) ×3 IMPLANT
GLOVE BIOGEL PI IND STRL 7.0 (GLOVE) ×1 IMPLANT
GLOVE BIOGEL PI IND STRL 8 (GLOVE) ×2 IMPLANT
GLOVE BIOGEL PI INDICATOR 7.0 (GLOVE) ×2
GLOVE BIOGEL PI INDICATOR 8 (GLOVE) ×4
GOWN STRL REUS W/ TWL LRG LVL3 (GOWN DISPOSABLE) ×2 IMPLANT
GOWN STRL REUS W/TWL LRG LVL3 (GOWN DISPOSABLE) ×6
GUIDEWIRE GAMMA (WIRE) ×2 IMPLANT
GUIDEWIRE GAMMA 800 (WIRE) ×2 IMPLANT
KIT BASIN OR (CUSTOM PROCEDURE TRAY) ×3 IMPLANT
KIT ROOM TURNOVER OR (KITS) ×3 IMPLANT
MANIFOLD NEPTUNE II (INSTRUMENTS) ×3 IMPLANT
NAIL TIBIAL STANDARD (Nail) ×3 IMPLANT
NAIL TIBIAL STD (Nail) IMPLANT
NS IRRIG 1000ML POUR BTL (IV SOLUTION) ×3 IMPLANT
PACK ORTHO EXTREMITY (CUSTOM PROCEDURE TRAY) ×3 IMPLANT
PACK UNIVERSAL I (CUSTOM PROCEDURE TRAY) ×3 IMPLANT
PAD ARMBOARD 7.5X6 YLW CONV (MISCELLANEOUS) ×6 IMPLANT
PAD CAST 4YDX4 CTTN HI CHSV (CAST SUPPLIES) ×1 IMPLANT
PADDING CAST COTTON 4X4 STRL (CAST SUPPLIES)
REAMER SHAFT BIXCUT (INSTRUMENTS) ×2 IMPLANT
SCREW LOCKING T2 F/T  5MMX45MM (Screw) ×2 IMPLANT
SCREW LOCKING T2 F/T  5X32.5MM (Screw) ×2 IMPLANT
SCREW LOCKING T2 F/T  5X37.5MM (Screw) ×4 IMPLANT
SCREW LOCKING T2 F/T 5MMX45MM (Screw) IMPLANT
SCREW LOCKING T2 F/T 5X32.5MM (Screw) IMPLANT
SCREW LOCKING T2 F/T 5X37.5MM (Screw) IMPLANT
SPONGE LAP 18X18 X RAY DECT (DISPOSABLE) ×1 IMPLANT
STAPLER VISISTAT 35W (STAPLE) IMPLANT
SUT ETHILON 3 0 PS 1 (SUTURE) ×4 IMPLANT
SUT MNCRL AB 4-0 PS2 18 (SUTURE) IMPLANT
SUT MON AB 2-0 CT1 27 (SUTURE) ×1 IMPLANT
SUT VIC AB 0 CT1 27 (SUTURE) ×3
SUT VIC AB 0 CT1 27XBRD ANBCTR (SUTURE) ×1 IMPLANT
SYR BULB IRRIGATION 50ML (SYRINGE) ×3 IMPLANT
TOWEL OR 17X24 6PK STRL BLUE (TOWEL DISPOSABLE) ×3 IMPLANT
TOWEL OR 17X26 10 PK STRL BLUE (TOWEL DISPOSABLE) ×3 IMPLANT
TOWEL OR NON WOVEN STRL DISP B (DISPOSABLE) ×3 IMPLANT
TUBE CONNECTING 12'X1/4 (SUCTIONS)
TUBE CONNECTING 12X1/4 (SUCTIONS) ×1 IMPLANT
TUBING CYSTO DISP (UROLOGICAL SUPPLIES) ×2 IMPLANT
WATER STERILE IRR 1000ML POUR (IV SOLUTION) ×1 IMPLANT
YANKAUER SUCT BULB TIP NO VENT (SUCTIONS) ×3 IMPLANT

## 2015-06-18 NOTE — Transfer of Care (Signed)
Immediate Anesthesia Transfer of Care Note  Patient: Taylor [name]  Procedure(s) Performed: Procedure(s): INTRAMEDULLARY (IM) NAIL TIBIAL (Left)  Patient Location: PACU  Anesthesia Type:General  Level of Consciousness: awake  Airway & Oxygen Therapy: Patient Spontanous Breathing and Patient connected to nasal cannula oxygen  Post-op Assessment: Report given to RN and Post -op Vital signs reviewed and stable  Post vital signs: Reviewed and stable  Last Vitals:  Filed Vitals:   06/18/15 0730 06/18/15 1115  BP: 157/110   Pulse: 116   Temp:  36.7 C  Resp: 18     Last Pain:  Filed Vitals:   06/18/15 1119  PainSc: 10-Worst pain ever         Complications: No apparent anesthesia complications

## 2015-06-18 NOTE — ED Notes (Signed)
Ortho at bedside.  Paged ortho tech to place splint.

## 2015-06-18 NOTE — Progress Notes (Signed)
RT at beside on arrival.  Pt airway patent.  Rt will monitor.
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
## 2015-06-18 NOTE — Progress Notes (Signed)
Pt's sister, Aggie CosierCrystal, is the only person who has a security code to contact the patient.  She has been instructed to keep this confidential.  Pt is restricted in the system as XXX and she has also been instructed not to call anyone or give anyone information about her location/situation.

## 2015-06-18 NOTE — ED Notes (Signed)
Spoke with pt's sister- Taylor [name]-- is on the way here-- does have security code [id number].
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
## 2015-06-18 NOTE — ED Notes (Signed)
Pt on way to OR holding area. Dr. Wandra Feinstein. Murphy speaking to pt in the hallway.

## 2015-06-18 NOTE — ED Notes (Signed)
Report received from Onalee Huaavid, [place].  Pt continuously crying-- pt made XXX-- explained process to pt, states her sister Rollene RotundaCrystal Haggart is coming. Explained that she needed to give her the code 3120.

## 2015-06-18 NOTE — Anesthesia Postprocedure Evaluation (Signed)
Anesthesia Post Note  Patient: Taylor [name]  Procedure(s) Performed: Procedure(s) (LRB): INTRAMEDULLARY (IM) NAIL TIBIAL (Left)  Patient location during evaluation: PACU Anesthesia Type: General Level of consciousness: sedated, oriented and patient cooperative Pain management: pain level controlled Vital Signs Assessment: post-procedure vital signs reviewed and stable Respiratory status: spontaneous breathing, nonlabored ventilation, respiratory function stable and patient connected to nasal cannula oxygen Cardiovascular status: blood pressure returned to baseline and stable Postop Assessment: no signs of nausea or vomiting Anesthetic complications: no    Last Vitals:  Filed Vitals:   06/18/15 1115 06/18/15 1130  BP: 122/84   Pulse: 105 101  Temp: 36.7 C   Resp: 18 17    Last Pain:  Filed Vitals:   06/18/15 1133  PainSc: 8                  Jacek Colson,E. Ameia Morency

## 2015-06-18 NOTE — Discharge Instructions (Addendum)
RIGHT HAND: KEEP SPLINT ON, KEEPING IT CLEAN AND DRY. NO LIFTING, GRIPPING OR GRASPING WITH THE RIGHT HAND GREATER THAN THAT NECESSARY TO HOLD A PENCIL.  Left leg: Keep dressing on, clean and dry  No driving while taking oxycodone.
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
## 2015-06-18 NOTE — ED Notes (Signed)
EMS gave Fentanyl and 200cc of NS PTA.
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
## 2015-06-18 NOTE — Anesthesia Procedure Notes (Signed)
Procedure Name: Intubation Date/Time: 06/18/2015 9:34 AM Performed by: Little IshikawaMERCER, Quadasia Newsham L Pre-anesthesia Checklist: Patient identified, Emergency Drugs available, Suction available, Patient being monitored and Timeout performed Patient Re-evaluated:Patient Re-evaluated prior to inductionOxygen Delivery Method: Circle system utilized Preoxygenation: Pre-oxygenation with 100% oxygen Intubation Type: IV induction Ventilation: Mask ventilation without difficulty Laryngoscope Size: Mac and 3 Grade View: Grade I Tube type: Oral Tube size: 7.0 mm Number of attempts: 1 Airway Equipment and Method: Stylet Placement Confirmation: ETT inserted through vocal cords under direct vision,  positive ETCO2 and breath sounds checked- equal and bilateral Secured at: 21 cm Tube secured with: Tape Dental Injury: Teeth and Oropharynx as per pre-operative assessment

## 2015-06-18 NOTE — Op Note (Signed)
06/18/2015  11:00 AM  PATIENT:  Taylor [name]    PRE-OPERATIVE DIAGNOSIS:  Left Tibia Fracture  POST-OPERATIVE DIAGNOSIS:  Same  PROCEDURE:  INTRAMEDULLARY (IM) NAIL TIBIAL  SURGEON:  Donat Humble D, MD  ASSISTANT: none  ANESTHESIA:   General  PREOPERATIVE INDICATIONS:  Taylor [name] is a  45 y.o. female with a diagnosis of Left Tibia Fracture who failed conservative measures and elected for surgical management.    The risks benefits and alternatives were discussed with the patient preoperatively including but not limited to the risks of infection, bleeding, nerve injury, cardiopulmonary complications, the need for revision surgery, among others, and the patient was willing to proceed.  OPERATIVE IMPLANTS: Stryker T2 Tibial nail 315x9   BLOOD LOSS: minimal  COMPLICATIONS: none  TOURNIQUET TIME: none  OPERATIVE PROCEDURE:  Patient was identified in the preoperative holding area and site was marked by me He was transported to the operating theater and placed on the table in supine position taking care to pad all bony prominences. After a preincinduction time out anesthesia was induced. The left lower extremity was prepped and draped in normal sterile fashion and a pre-incision timeout was performed. Taylor [name] received ancef for preoperative antibiotics.   I incised the skin to deliver her bone fragments at the open portion of her fracture. She had a roughly 5 cm laceration here from her exit site.  I performed a thorough excisional debridement of all contaminated and devitalized skin muscle and bone around the open fracture site. I then performed a complex closure of this 5 cm wound.  The leg was placed over the radiolucent triangle. I then made a 4 cm incision over the patella tendon. I dissected down and incised the patella tendon taking care to not penetrate into the joint itself.   I placed a guidewire under fluoroscopic guidance just medial to lateral tibial  spine. I was happy with this placement on all views. I used the entry reamer to create a path into the proximal tibia staying out of the joint itself.  I then passed the ball tip guidewire down the tibia and across the fracture site. I held appropriate reduction confirmed on multiple views of fluoroscopy and sequentially reamed up to an appropriate size with appropriate chatter. I selected the above-sized nail and passed it down the ball-tipped guidewire and seated it completely to a was sunk into the proximal tibia.  I then used the outrigger placed a static  2 oblique proximal locking screws. As happy with her length and placement on multiple oblique fluoroscopic views.  Next I confirmed appropriate rotation of the tibia with fracture tease and orientation of the patella and toes. I then used a perfect circles technique to place 2 distal static interlock screw.  I placed a wound VAC over her open fracture site  The wounds were then all thoroughly irrigated and closed in layers. Sterile dressing was applied and he was taken to the PACU in stable condition.  POST OPERATIVE PLAN: TDWB, DVT prophylaxis: Early ambulation, SCD's, and chemical px  This note was generated using a template and dragon dictation system. In light of that, I have reviewed the note and all aspects of it are applicable to this case. Any dictation errors are due to the computerized dictation system.
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
## 2015-06-18 NOTE — ED Provider Notes (Signed)
PROCEDURE: LACERATION REPAIR  Performed by Eston Estersyler M Gradie Butrick, PA-C Consent: Informed consent, after discussion of the risks, benefits, and alternatives to the procedure, was obtained Location: Dorsal 5th digit along proximal MCP, Anterior 5th digit along proximal MCP  Length: 0.5cm, 0.5cm Distal CMS: Normal. No deficits. Neurovascularly intact. Anesthesia: 0.5 mL Lido without Epi Preparation: The wound was cleaned with betadine solution and irrigated with normal saline. The area was prepped and draped in the usual sterile fashion.  Exploration: The wound was explored and no foreign bodies were found. Procedure: The skin was closed with loose 4-0 Prolene (one on dorsal and one on anterior). There was loose approximation, In total, TWO were used.  Post-Procedure: Good closure and hemostasis. The patient tolerated the procedure well and there were no complications. CSM remain intact. Post procedure dressing applied by RN.     Audry Piliyler Torrian Canion, PA-C 06/18/15 0703  Cy BlamerApril Palumbo, MD 06/18/15 [phone]
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
## 2015-06-18 NOTE — ED Notes (Signed)
Visitor password established for patient: [id number]

## 2015-06-18 NOTE — Progress Notes (Signed)
Orthopedic Tech Progress Note Patient Details:  Taylor [name] August 22, 1970 [id number]  Ortho Devices Type of Ortho Device: Ulna gutter splint, Ace wrap Ortho Device/Splint Interventions: Application   Saul FordyceJennifer C Lirio Bach 06/18/2015, 9:05 AM
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
## 2015-06-19 ENCOUNTER — Encounter (HOSPITAL_COMMUNITY): Payer: Self-pay | Admitting: Orthopedic Surgery

## 2015-06-19 DIAGNOSIS — S62604A Fracture of unspecified phalanx of right ring finger, initial encounter for closed fracture: Secondary | ICD-10-CM | POA: Diagnosis present

## 2015-06-19 DIAGNOSIS — F430 Acute stress reaction: Secondary | ICD-10-CM | POA: Diagnosis present

## 2015-06-19 DIAGNOSIS — S82202A Unspecified fracture of shaft of left tibia, initial encounter for closed fracture: Secondary | ICD-10-CM | POA: Diagnosis present

## 2015-06-19 DIAGNOSIS — D62 Acute posthemorrhagic anemia: Secondary | ICD-10-CM | POA: Diagnosis not present

## 2015-06-19 LAB — CBC
HEMATOCRIT: 35.3 % — AB (ref 36.0–46.0)
HEMOGLOBIN: 11.4 g/dL — AB (ref 12.0–15.0)
MCH: 27.1 pg (ref 26.0–34.0)
MCHC: 32.3 g/dL (ref 30.0–36.0)
MCV: 84 fL (ref 78.0–100.0)
PLATELETS: 242 10*3/uL (ref 150–400)
RBC: 4.2 MIL/uL (ref 3.87–5.11)
RDW: 13.3 % (ref 11.5–15.5)
WBC: 18.3 10*3/uL — AB (ref 4.0–10.5)

## 2015-06-19 LAB — URINALYSIS, ROUTINE W REFLEX MICROSCOPIC
Bilirubin Urine: NEGATIVE
Glucose, UA: 250 mg/dL — AB
Hgb urine dipstick: NEGATIVE
KETONES UR: NEGATIVE mg/dL
LEUKOCYTES UA: NEGATIVE
NITRITE: NEGATIVE
PROTEIN: NEGATIVE mg/dL
Specific Gravity, Urine: 1.012 (ref 1.005–1.030)
pH: 6 (ref 5.0–8.0)

## 2015-06-19 LAB — BASIC METABOLIC PANEL
Anion gap: 8 (ref 5–15)
BUN: 6 mg/dL (ref 6–20)
CALCIUM: 9.1 mg/dL (ref 8.9–10.3)
CO2: 21 mmol/L — AB (ref 22–32)
Chloride: 107 mmol/L (ref 101–111)
Creatinine, Ser: 0.9 mg/dL (ref 0.44–1.00)
GFR calc Af Amer: >60 mL/min (ref >60)
GLUCOSE: 169 mg/dL — AB (ref 65–99)
Potassium: 4.2 mmol/L (ref 3.5–5.1)
Sodium: 136 mmol/L (ref 135–145)

## 2015-06-19 LAB — GLUCOSE, CAPILLARY
GLUCOSE-CAPILLARY: 131 mg/dL — AB (ref 65–99)
GLUCOSE-CAPILLARY: 179 mg/dL — AB (ref 65–99)
GLUCOSE-CAPILLARY: 144 mg/dL — AB (ref 65–99)
Glucose-Capillary: 162 mg/dL — ABNORMAL HIGH (ref 65–99)
Glucose-Capillary: 178 mg/dL — ABNORMAL HIGH (ref 65–99)

## 2015-06-19 MED ORDER — TRAMADOL HCL 50 MG PO TABS
100.0000 mg | ORAL_TABLET | Freq: Four times a day (QID) | ORAL | Status: DC
Start: 2015-06-19 — End: 2015-06-22
  Administered 2015-06-19 – 2015-06-22 (×13): 100 mg via ORAL
  Filled 2015-06-19 (×13): qty 2

## 2015-06-19 MED ORDER — DOCUSATE SODIUM 100 MG PO CAPS
100.0000 mg | ORAL_CAPSULE | Freq: Two times a day (BID) | ORAL | Status: DC
  Administered 2015-06-19 – 2015-06-22 (×6): 100 mg via ORAL
  Filled 2015-06-19 (×6): qty 1

## 2015-06-19 MED ORDER — DIPHENHYDRAMINE HCL 25 MG PO CAPS
25.0000 mg | ORAL_CAPSULE | Freq: Four times a day (QID) | ORAL | Status: DC | PRN
  Administered 2015-06-19 – 2015-06-22 (×4): 25 mg via ORAL
  Filled 2015-06-19 (×4): qty 1

## 2015-06-19 MED ORDER — LORATADINE 10 MG PO TABS
10.0000 mg | ORAL_TABLET | Freq: Every day | ORAL | Status: DC
  Administered 2015-06-19 – 2015-06-22 (×4): 10 mg via ORAL
  Filled 2015-06-19 (×4): qty 1

## 2015-06-19 MED ORDER — BUDESONIDE-FORMOTEROL FUMARATE 80-4.5 MCG/ACT IN AERO: 2.0000 | INHALATION_SPRAY | Freq: Two times a day (BID) | RESPIRATORY_TRACT | Status: DC

## 2015-06-19 MED ORDER — OXYCODONE HCL 5 MG PO TABS
5.0000 mg | ORAL_TABLET | ORAL | Status: DC | PRN
  Administered 2015-06-19 (×2): 15 mg via ORAL
  Administered 2015-06-20: 10 mg via ORAL
  Administered 2015-06-20 – 2015-06-22 (×9): 15 mg via ORAL
  Filled 2015-06-19 (×4): qty 3
  Filled 2015-06-19: qty 2
  Filled 2015-06-19 (×8): qty 3

## 2015-06-19 MED ORDER — MORPHINE SULFATE (PF) 2 MG/ML IV SOLN
2.0000 mg | INTRAVENOUS | Status: DC | PRN
Start: 2015-06-19 — End: 2015-06-20

## 2015-06-19 MED ORDER — LISINOPRIL 40 MG PO TABS
40.0000 mg | ORAL_TABLET | Freq: Every day | ORAL | Status: DC
  Administered 2015-06-19 – 2015-06-22 (×3): 40 mg via ORAL
  Filled 2015-06-19 (×4): qty 1

## 2015-06-19 MED ORDER — MOMETASONE FURO-FORMOTEROL FUM 200-5 MCG/ACT IN AERO
2.0000 | INHALATION_SPRAY | Freq: Two times a day (BID) | RESPIRATORY_TRACT | Status: DC
  Administered 2015-06-21 – 2015-06-22 (×2): 2 via RESPIRATORY_TRACT
  Filled 2015-06-19: qty 8.8

## 2015-06-19 MED ORDER — INSULIN GLARGINE 100 UNIT/ML ~~LOC~~ SOLN
42.0000 [IU] | Freq: Every day | SUBCUTANEOUS | Status: DC
  Administered 2015-06-19 – 2015-06-21 (×3): 42 [IU] via SUBCUTANEOUS
  Filled 2015-06-19 (×4): qty 0.42

## 2015-06-19 MED ORDER — HYDROCHLOROTHIAZIDE 25 MG PO TABS
25.0000 mg | ORAL_TABLET | Freq: Every day | ORAL | Status: DC
  Administered 2015-06-19 – 2015-06-22 (×4): 25 mg via ORAL
  Filled 2015-06-19 (×4): qty 1

## 2015-06-19 MED ORDER — ATENOLOL 50 MG PO TABS
25.0000 mg | ORAL_TABLET | Freq: Every day | ORAL | Status: DC
  Administered 2015-06-19 – 2015-06-22 (×3): 25 mg via ORAL
  Filled 2015-06-19 (×4): qty 1

## 2015-06-19 MED ORDER — CYCLOBENZAPRINE HCL 10 MG PO TABS
10.0000 mg | ORAL_TABLET | ORAL | Status: DC | PRN
  Administered 2015-06-20 – 2015-06-22 (×5): 10 mg via ORAL
  Filled 2015-06-19 (×5): qty 1

## 2015-06-19 MED ORDER — ZOLPIDEM TARTRATE 5 MG PO TABS: 5.0000 mg | ORAL_TABLET | Freq: Every evening | ORAL | Status: DC | PRN

## 2015-06-19 MED ORDER — ALPRAZOLAM 0.5 MG PO TABS
1.0000 mg | ORAL_TABLET | Freq: Three times a day (TID) | ORAL | Status: DC | PRN
Start: 2015-06-19 — End: 2015-06-22
  Administered 2015-06-19 – 2015-06-22 (×4): 1 mg via ORAL
  Filled 2015-06-19 (×4): qty 2

## 2015-06-19 MED ORDER — POLYETHYLENE GLYCOL 3350 17 G PO PACK
17.0000 g | PACK | Freq: Every day | ORAL | Status: DC
  Administered 2015-06-19 – 2015-06-22 (×4): 17 g via ORAL
  Filled 2015-06-19 (×4): qty 1
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
## 2015-06-19 NOTE — Progress Notes (Signed)
Pain is well-controlled  Patient is resting comfortably the left lower extremity compartments are soft she is neurovascularly intact dressings are benign she has a small amount of drainage likely from her entry wound site.  Plan Touchdown weightbearing LLE Okay to use platform walker Okay to discharge when able follow-up in my office in 2 weeks  Please remove wound VAC per nursing prior to discharge   Rana Adorno D
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
## 2015-06-20 LAB — GLUCOSE, CAPILLARY
Glucose-Capillary: 188 mg/dL — ABNORMAL HIGH (ref 65–99)
Glucose-Capillary: 101 mg/dL — ABNORMAL HIGH (ref 65–99)
Glucose-Capillary: 124 mg/dL — ABNORMAL HIGH (ref 65–99)
Glucose-Capillary: 154 mg/dL — ABNORMAL HIGH (ref 65–99)
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
## 2015-06-20 NOTE — Progress Notes (Signed)
   06/20/15 0107  What Happened  Was fall witnessed? Yes  Who witnessed fall? Nino GlowNiki Wiggs  Patients activity before fall bathroom-assisted  Point of contact head;buttocks  Was patient injured? (scap on left flank)  Follow Up  MD notified Derrell Lollingamirez  Time MD notified [phone]  Additional tests No  Simple treatment (just to watch pt at this time per Dr. Derrell Lollingamirez )  Progress note created (see row info) Yes  Adult Fall Risk Assessment  Risk Factor Category (scoring not indicated) Fall has occurred during this admission (document High fall risk)  Age 45  Fall History: Fall within 6 months prior to admission 5  Elimination; Bowel and/or Urine Incontinence 0  Elimination; Bowel and/or Urine Urgency/Frequency 0  Medications: includes PCA/Opiates, Anti-convulsants, Anti-hypertensives, Diuretics, Hypnotics, Laxatives, Sedatives, and Psychotropics 5  Patient Care Equipment 2  Mobility-Assistance 2  Mobility-Gait 2  Mobility-Sensory Deficit 0  Cognition-Awareness 0  Cognition-Impulsiveness 2  Cognition-Limitations 0  Total Score 18  Patient's Fall Risk High Fall Risk (>13 points)  Adult Fall Risk Interventions  Required Bundle Interventions *See Row Information* High fall risk - low, moderate, and high requirements implemented  Additional Interventions Bed alarm not indicated with the bundle;Fall risk signage;Individualized elimination schedule;Secure all tubes/drains;Use of appropriate toileting equipment (bedpan, BSC, etc.)  Fall with Injury Screening  Risk For Fall Injury- See Row Information  F;Nurse judgement  Intervention(s) for 2 or more risk criteria identified Gait Belt  Pain Assessment  Pain Assessment 0-10  Pain Score 0  Neurological  Neuro (WDL) WDL  Level of Consciousness Alert  Orientation Level Oriented X4  Glasgow Coma Scale  Eye Opening 4  Best Verbal Response (NON-intubated) 5  Best Motor Response 6  Glasgow Coma Scale Score 15  Musculoskeletal  Musculoskeletal (WDL) WDL   Assistive Device BSC;Front wheel walker  Generalized Weakness Yes  Weight Bearing Restrictions Yes  RUE Weight Bearing Weight bear through elbow only  LLE Weight Bearing WBAT  Musculoskeletal Details  RUE Limited movement  RUE Ortho/Supportive Device Ace wrap  RLE Limited movement;Surgery  LLE Limited movement;Surgery  LLE Ortho/Supportive Device Ace wrap;Other (Comment) (wound vac)  Integumentary  Integumentary (WDL) X  Skin Color Appropriate for ethnicity  Skin Condition Dry  Skin Integrity Surgical Incision (see LDA)  Nurse will continue to monitor patient.
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
## 2015-06-21 LAB — GLUCOSE, CAPILLARY
GLUCOSE-CAPILLARY: 138 mg/dL — AB (ref 65–99)
GLUCOSE-CAPILLARY: 167 mg/dL — AB (ref 65–99)
GLUCOSE-CAPILLARY: 163 mg/dL — AB (ref 65–99)
Glucose-Capillary: 152 mg/dL — ABNORMAL HIGH (ref 65–99)
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
## 2015-06-21 NOTE — Progress Notes (Signed)
   06/21/15 0950  Clinical Encounter Type  Visited With Patient  Visit Type Follow-up  Spiritual Encounters  Spiritual Needs Prayer;Emotional;Grief support  Stress Factors  Patient Stress Factors Exhausted;Health changes;Loss of control;Major life changes  Visited with patient as a follow up.  Had prayer and gave patient a prayer shawl.
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
## 2015-06-22 LAB — GLUCOSE, CAPILLARY
GLUCOSE-CAPILLARY: 150 mg/dL — AB (ref 65–99)
Glucose-Capillary: 190 mg/dL — ABNORMAL HIGH (ref 65–99)

## 2015-06-22 MED ORDER — OXYCODONE-ACETAMINOPHEN 7.5-325 MG PO TABS: 1.0000 | ORAL_TABLET | ORAL | Status: DC | PRN

## 2015-06-22 MED ORDER — TRAMADOL HCL 50 MG PO TABS: 100.0000 mg | ORAL_TABLET | Freq: Four times a day (QID) | ORAL | Status: DC
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
## 2015-06-22 NOTE — Progress Notes (Signed)
Pt ready to be discharged home. IV removed. Wound vac removed, new dressing in place. Pt. Is alert and oriented. Pt is hemodynamically stable. AVS reviewed with pt. Capable of re verbalizing medication regimen. Discharge plan appropriate and in place.
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
## 2015-06-22 NOTE — Care Management Note (Signed)
Case Management Note  Patient Details  Name: Edwyna Perfectosha T XXXGibbs MRN: [id number] Date of Birth: [birth year]  Subjective/Objective: Pt for dc home today with sister to provide 24hr care.                   Action/Plan: DME ordered by PA[facility]; to be delivered to pt's room prior to dc home.    Expected Discharge Date:     06/22/2015             Expected Discharge Plan:  Home/Self Care  In-House Referral:  Clinical Social Work  Discharge planning Services  CM Consult  Post Acute Care Choice:  Durable Medical Equipment Choice offered to:     DME Arranged:  3-N-1, Walker rolling, Tub bench DME Agency:  [facility]  HH Arranged:  NA HH Agency:  NA  Status of Service:  Completed, signed off  Medicare Important Message Given:    Date Medicare IM Given:    Medicare IM give by:    Date Additional Medicare IM Given:    Additional Medicare Important Message give by:     If discussed at Long Length of Stay Meetings, dates discussed:    Additional Comments:  Quintella BatonJulie W. Angelyna Henderson, RN, BSN  Trauma/Neuro ICU Case Manager [phone]
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
## 2015-06-22 NOTE — Progress Notes (Signed)
   06/22/15 0930  Clinical Encounter Type  Visited With Patient  Visit Type Spiritual support  Spiritual Encounters  Spiritual Needs Emotional  Stress Factors  Patient Stress Factors Major life changes  Visited with patient.  Patient going home today. Patient positive and smiling.  Offered words of encouragement.

## 2015-07-04 ENCOUNTER — Ambulatory Visit: Payer: Medicare Other | Attending: Orthopedic Surgery | Admitting: Occupational Therapy

## 2015-07-04 DIAGNOSIS — M79641 Pain in right hand: Secondary | ICD-10-CM | POA: Insufficient documentation

## 2015-07-04 DIAGNOSIS — M25641 Stiffness of right hand, not elsewhere classified: Secondary | ICD-10-CM | POA: Diagnosis present

## 2015-07-04 DIAGNOSIS — R600 Localized edema: Secondary | ICD-10-CM | POA: Insufficient documentation

## 2015-07-04 DIAGNOSIS — M6281 Muscle weakness (generalized): Secondary | ICD-10-CM

## 2015-07-04 NOTE — Patient Instructions (Signed)
WEARING SCHEDULE:  Wear splint at ALL times except for hygiene care (May remove splint for exercises and then immediately place back on ONLY if directed by the therapist)  PURPOSE:  To prevent movement and for protection until injury can heal  CARE OF SPLINT:  Keep splint away from heat sources including: stove, radiator or furnace, or a car in sunlight. The splint can melt and will no longer fit you properly  Keep away from pets and children  Clean the splint with rubbing alcohol 1-2 times per day.  * During this time, make sure you also clean your hand/arm as instructed by your therapist and/or perform dressing changes as needed. Then dry hand/arm completely before replacing splint. (When cleaning hand/arm, keep it immobilized in same position until splint is replaced)  PRECAUTIONS/POTENTIAL PROBLEMS: *If you notice or experience increased pain, swelling, numbness, or a lingering reddened area from the splint: Contact your therapist immediately by calling [phone]. You must wear the splint for protection, but we will get you scheduled for adjustments as quickly as possible.  (If only straps or hooks need to be replaced and NO adjustments to the splint need to be made, just call the office ahead and let them know you are coming in)  If you have any medical concerns or signs of infection, please call your doctor immediately        MP Flexion (Active Isolated)   Bend __each____ finger at large knuckle, keeping other fingers straight. Do not bend tips. Repeat _10-15___ times. Do __4-6__ sessions per day.  AROM: PIP Flexion / Extension   Pinch bottom knuckle of __ring and small______ finger of hand to prevent bending. Actively bend middle knuckle until stretch is felt. Hold __5__ seconds. Relax. Straighten finger as far as possible. Repeat __10-15__ times per set. Do _4-6___ sessions per day.   AROM: DIP Flexion / Extension   Pinch middle knuckle of __ring and small ______  finger of  hand to prevent bending. Bend end knuckle until stretch is felt. Hold _5___ seconds. Relax. Straighten finger as far as possible. Repeat _10-15___ times per set.  Do _4-6___ sessions per day.  AROM: Finger Flexion / Extension   Actively bend fingers of  hand. Start with knuckles furthest from palm, and slowly make a fist. Hold __5__ seconds. Relax. Then straighten fingers as far as possible. Repeat _10-15___ times per set.  Do _4-6___ sessions per day.  Copyright  VHI. All rights reserved.
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
## 2015-07-05 ENCOUNTER — Encounter: Payer: Self-pay | Admitting: Neurology

## 2015-07-05 ENCOUNTER — Encounter: Payer: Self-pay | Admitting: Occupational Therapy

## 2015-07-05 NOTE — Therapy (Signed)
[facility] [address] [place], [place], [address] Phone: [phone]   Fax:  [phone]  Patient Details  Name: Edwyna Perfectosha T XxxGibbs MRN: [id number] Date of Birth: 09/01/1970 Referring Provider:  No ref. provider found  Encounter Date: 07/05/2015   Therapist spoke with Lyla Sonarrie, Dr. Carollee Massedhompson's CMA, and new  new verbal orders were received from Dr. Janee Mornhompson regarding her right hand. Pt is cleared for AA/ROM and gentle P/ROM at this time.  Pt may begin strengthening 1 month post injury.  Saafir Abdullah 07/05/2015, 12:53 PM  [facility] [address] [place], [place], [address] Phone: [phone]   Fax:  [phone]

## 2015-07-16 ENCOUNTER — Ambulatory Visit: Payer: Medicare Other | Admitting: Occupational Therapy

## 2015-07-16 ENCOUNTER — Encounter: Payer: Self-pay | Admitting: Occupational Therapy

## 2015-07-16 DIAGNOSIS — M79641 Pain in right hand: Secondary | ICD-10-CM

## 2015-07-16 DIAGNOSIS — M6281 Muscle weakness (generalized): Secondary | ICD-10-CM

## 2015-07-16 DIAGNOSIS — M25641 Stiffness of right hand, not elsewhere classified: Secondary | ICD-10-CM

## 2015-07-16 DIAGNOSIS — R600 Localized edema: Secondary | ICD-10-CM
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
## 2015-07-18 ENCOUNTER — Encounter: Payer: Self-pay | Admitting: Occupational Therapy

## 2015-07-18 ENCOUNTER — Ambulatory Visit: Payer: Medicare Other | Admitting: Occupational Therapy

## 2015-07-18 DIAGNOSIS — M79641 Pain in right hand: Secondary | ICD-10-CM

## 2015-07-18 DIAGNOSIS — M25641 Stiffness of right hand, not elsewhere classified: Secondary | ICD-10-CM

## 2015-07-18 DIAGNOSIS — R600 Localized edema: Secondary | ICD-10-CM

## 2015-07-18 DIAGNOSIS — M6281 Muscle weakness (generalized): Secondary | ICD-10-CM
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
## 2015-07-23 ENCOUNTER — Encounter: Payer: Medicaid Other | Admitting: Occupational Therapy

## 2015-07-27 ENCOUNTER — Encounter: Payer: Self-pay | Admitting: Occupational Therapy

## 2015-07-27 ENCOUNTER — Ambulatory Visit: Payer: Medicare Other | Attending: Orthopedic Surgery | Admitting: Occupational Therapy

## 2015-07-27 DIAGNOSIS — M25562 Pain in left knee: Secondary | ICD-10-CM | POA: Insufficient documentation

## 2015-07-27 DIAGNOSIS — R600 Localized edema: Secondary | ICD-10-CM | POA: Diagnosis present

## 2015-07-27 DIAGNOSIS — M6281 Muscle weakness (generalized): Secondary | ICD-10-CM | POA: Diagnosis present

## 2015-07-27 DIAGNOSIS — M25572 Pain in left ankle and joints of left foot: Secondary | ICD-10-CM | POA: Insufficient documentation

## 2015-07-27 DIAGNOSIS — R296 Repeated falls: Secondary | ICD-10-CM | POA: Insufficient documentation

## 2015-07-27 DIAGNOSIS — M79641 Pain in right hand: Secondary | ICD-10-CM

## 2015-07-27 DIAGNOSIS — M25641 Stiffness of right hand, not elsewhere classified: Secondary | ICD-10-CM

## 2015-07-27 DIAGNOSIS — R262 Difficulty in walking, not elsewhere classified: Secondary | ICD-10-CM | POA: Insufficient documentation

## 2015-07-27 DIAGNOSIS — M79662 Pain in left lower leg: Secondary | ICD-10-CM | POA: Diagnosis present
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
## 2015-07-27 NOTE — Patient Instructions (Signed)
Yellow resistance putty. Complete each exercise 10 times.  Complete exercises twice daily.    1)  Hold putty in your hand and squeeze until it changes shape.  Try to leave your finger imprints in putty.      2)  Roll the putty into a hot dog shape.  Pinch the hot dog with each finger in turn.  Index, long, ring, and pinky.    3) Take a smaller section of the putty and pinch it between each of your fingers.

## 2015-07-30 ENCOUNTER — Ambulatory Visit: Payer: Medicare Other | Admitting: Physical Therapy

## 2015-07-30 ENCOUNTER — Encounter: Payer: Self-pay | Admitting: Physical Therapy

## 2015-07-30 DIAGNOSIS — M79662 Pain in left lower leg: Secondary | ICD-10-CM

## 2015-07-30 DIAGNOSIS — M6281 Muscle weakness (generalized): Secondary | ICD-10-CM

## 2015-07-30 DIAGNOSIS — M25572 Pain in left ankle and joints of left foot: Secondary | ICD-10-CM

## 2015-07-30 DIAGNOSIS — M79641 Pain in right hand: Secondary | ICD-10-CM | POA: Diagnosis not present

## 2015-07-30 DIAGNOSIS — R296 Repeated falls: Secondary | ICD-10-CM

## 2015-07-30 DIAGNOSIS — R262 Difficulty in walking, not elsewhere classified: Secondary | ICD-10-CM

## 2015-07-30 DIAGNOSIS — M25562 Pain in left knee: Secondary | ICD-10-CM
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
## 2015-07-30 NOTE — Patient Instructions (Signed)
Access Code: [email or web address]  URL: [email or web address]  Date: 07/30/2015  Prepared by: Army FossaJessica Coretta Leisey   Exercises  Standing Weight Shift Side to Side - 3 min hold - 2x daily - 7x weekly  Standing Weight Shifting Forward and Backward - 2 min hold - 2x daily - 7x weekly  Seated Heel Slide - 10 reps - 2 sets - 5 hold - 2x daily - 7x weekly  Supine Quad Set - 30 reps - 1 sets - 5 hold - 2x daily - 7x weekly  Supine Gluteal Sets - 30 reps - 1 sets - 5 hold - 2x daily - 7x weekly  Supine Ankle Inversion AROM  Supine Ankle Eversion AROM  Supine Ankle Dorsiflexion and Plantarflexion AROM

## 2015-07-31 ENCOUNTER — Encounter: Payer: Self-pay | Admitting: Occupational Therapy

## 2015-07-31 ENCOUNTER — Ambulatory Visit: Payer: Medicare Other | Admitting: Occupational Therapy

## 2015-07-31 DIAGNOSIS — M79641 Pain in right hand: Secondary | ICD-10-CM

## 2015-07-31 DIAGNOSIS — M25641 Stiffness of right hand, not elsewhere classified: Secondary | ICD-10-CM

## 2015-07-31 DIAGNOSIS — R600 Localized edema: Secondary | ICD-10-CM
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
## 2015-07-31 NOTE — Patient Instructions (Addendum)
   WEAR NEW FINGER SPLINT AT NIGHT AND IN BETWEEN EXERCISES WITH BUDDY STRAP    EXERCISE: WEAR BUDDY STRAP when doing below exercise  Keep wrist neutral, bend big knuckles while keeping fingers straight (rooftop), then make fist while holding down small finger big knuckle, then straighten straighten small finger with ring finger while keeping hold on big knuckle down.   Can also perform "rooftop" exercise (bending BIG knuckles of all fingers, while keeping fingers straight) with splint and strap ON.   Continue putty ex's with splint off

## 2015-08-02 ENCOUNTER — Ambulatory Visit: Payer: Medicare Other | Admitting: Occupational Therapy

## 2015-08-02 DIAGNOSIS — M79641 Pain in right hand: Secondary | ICD-10-CM

## 2015-08-02 DIAGNOSIS — M25641 Stiffness of right hand, not elsewhere classified: Secondary | ICD-10-CM
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
## 2015-08-07 ENCOUNTER — Ambulatory Visit: Payer: Medicare Other | Admitting: Occupational Therapy

## 2015-08-07 DIAGNOSIS — M25641 Stiffness of right hand, not elsewhere classified: Secondary | ICD-10-CM

## 2015-08-07 DIAGNOSIS — M79641 Pain in right hand: Secondary | ICD-10-CM | POA: Diagnosis not present
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
## 2015-08-08 ENCOUNTER — Encounter (HOSPITAL_BASED_OUTPATIENT_CLINIC_OR_DEPARTMENT_OTHER): Payer: Medicare Other | Attending: Surgery

## 2015-08-08 ENCOUNTER — Ambulatory Visit: Payer: Medicare Other | Admitting: Physical Therapy

## 2015-08-08 DIAGNOSIS — I10 Essential (primary) hypertension: Secondary | ICD-10-CM | POA: Insufficient documentation

## 2015-08-08 DIAGNOSIS — M6281 Muscle weakness (generalized): Secondary | ICD-10-CM

## 2015-08-08 DIAGNOSIS — M79662 Pain in left lower leg: Secondary | ICD-10-CM

## 2015-08-08 DIAGNOSIS — E11622 Type 2 diabetes mellitus with other skin ulcer: Secondary | ICD-10-CM | POA: Diagnosis present

## 2015-08-08 DIAGNOSIS — R262 Difficulty in walking, not elsewhere classified: Secondary | ICD-10-CM

## 2015-08-08 DIAGNOSIS — Z87891 Personal history of nicotine dependence: Secondary | ICD-10-CM | POA: Insufficient documentation

## 2015-08-08 DIAGNOSIS — K219 Gastro-esophageal reflux disease without esophagitis: Secondary | ICD-10-CM | POA: Diagnosis not present

## 2015-08-08 DIAGNOSIS — Z79899 Other long term (current) drug therapy: Secondary | ICD-10-CM | POA: Diagnosis not present

## 2015-08-08 DIAGNOSIS — R296 Repeated falls: Secondary | ICD-10-CM

## 2015-08-08 DIAGNOSIS — M25572 Pain in left ankle and joints of left foot: Secondary | ICD-10-CM

## 2015-08-08 DIAGNOSIS — J45909 Unspecified asthma, uncomplicated: Secondary | ICD-10-CM | POA: Insufficient documentation

## 2015-08-08 DIAGNOSIS — M79641 Pain in right hand: Secondary | ICD-10-CM | POA: Diagnosis not present

## 2015-08-08 DIAGNOSIS — Z7951 Long term (current) use of inhaled steroids: Secondary | ICD-10-CM | POA: Insufficient documentation

## 2015-08-08 DIAGNOSIS — E1140 Type 2 diabetes mellitus with diabetic neuropathy, unspecified: Secondary | ICD-10-CM | POA: Diagnosis not present

## 2015-08-08 DIAGNOSIS — M25562 Pain in left knee: Secondary | ICD-10-CM

## 2015-08-08 DIAGNOSIS — Z794 Long term (current) use of insulin: Secondary | ICD-10-CM | POA: Insufficient documentation

## 2015-08-08 DIAGNOSIS — L97821 Non-pressure chronic ulcer of other part of left lower leg limited to breakdown of skin: Secondary | ICD-10-CM | POA: Insufficient documentation
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
## 2015-08-09 ENCOUNTER — Encounter: Payer: Medicaid Other | Admitting: Occupational Therapy

## 2015-08-09 ENCOUNTER — Encounter: Payer: Medicare Other | Admitting: Physical Therapy

## 2015-08-10 ENCOUNTER — Ambulatory Visit: Payer: Medicare Other | Admitting: Occupational Therapy

## 2015-08-10 ENCOUNTER — Encounter: Payer: Self-pay | Admitting: Occupational Therapy

## 2015-08-10 DIAGNOSIS — M25641 Stiffness of right hand, not elsewhere classified: Secondary | ICD-10-CM

## 2015-08-10 DIAGNOSIS — M79641 Pain in right hand: Secondary | ICD-10-CM | POA: Diagnosis not present

## 2015-08-10 DIAGNOSIS — R600 Localized edema: Secondary | ICD-10-CM
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
## 2015-08-14 ENCOUNTER — Ambulatory Visit: Payer: Medicare Other | Admitting: Occupational Therapy

## 2015-08-14 ENCOUNTER — Ambulatory Visit: Payer: Medicare Other | Admitting: Physical Therapy

## 2015-08-14 DIAGNOSIS — M25572 Pain in left ankle and joints of left foot: Secondary | ICD-10-CM

## 2015-08-14 DIAGNOSIS — M6281 Muscle weakness (generalized): Secondary | ICD-10-CM

## 2015-08-14 DIAGNOSIS — M79662 Pain in left lower leg: Secondary | ICD-10-CM

## 2015-08-14 DIAGNOSIS — R262 Difficulty in walking, not elsewhere classified: Secondary | ICD-10-CM

## 2015-08-14 DIAGNOSIS — M79641 Pain in right hand: Secondary | ICD-10-CM

## 2015-08-14 DIAGNOSIS — M25641 Stiffness of right hand, not elsewhere classified: Secondary | ICD-10-CM

## 2015-08-14 DIAGNOSIS — M25562 Pain in left knee: Secondary | ICD-10-CM

## 2015-08-14 DIAGNOSIS — R296 Repeated falls: Secondary | ICD-10-CM
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
## 2015-08-16 ENCOUNTER — Ambulatory Visit: Payer: Medicare Other | Admitting: Occupational Therapy

## 2015-08-16 ENCOUNTER — Encounter: Payer: Self-pay | Admitting: Physical Therapy

## 2015-08-16 ENCOUNTER — Ambulatory Visit: Payer: Medicare Other | Admitting: Physical Therapy

## 2015-08-16 DIAGNOSIS — M79641 Pain in right hand: Secondary | ICD-10-CM

## 2015-08-16 DIAGNOSIS — M6281 Muscle weakness (generalized): Secondary | ICD-10-CM

## 2015-08-16 DIAGNOSIS — M25562 Pain in left knee: Secondary | ICD-10-CM

## 2015-08-16 DIAGNOSIS — R296 Repeated falls: Secondary | ICD-10-CM

## 2015-08-16 DIAGNOSIS — M25572 Pain in left ankle and joints of left foot: Secondary | ICD-10-CM

## 2015-08-16 DIAGNOSIS — M79662 Pain in left lower leg: Secondary | ICD-10-CM

## 2015-08-16 DIAGNOSIS — M25641 Stiffness of right hand, not elsewhere classified: Secondary | ICD-10-CM

## 2015-08-16 DIAGNOSIS — R262 Difficulty in walking, not elsewhere classified: Secondary | ICD-10-CM

## 2015-08-16 DIAGNOSIS — R600 Localized edema: Secondary | ICD-10-CM
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
## 2015-08-20 ENCOUNTER — Ambulatory Visit: Payer: Medicare Other | Admitting: Physical Therapy

## 2015-08-21 ENCOUNTER — Ambulatory Visit: Payer: Medicare Other | Admitting: Physical Therapy

## 2015-08-21 ENCOUNTER — Ambulatory Visit: Payer: Medicare Other | Attending: Orthopedic Surgery | Admitting: Occupational Therapy

## 2015-08-21 ENCOUNTER — Encounter: Payer: Self-pay | Admitting: Occupational Therapy

## 2015-08-21 DIAGNOSIS — M79662 Pain in left lower leg: Secondary | ICD-10-CM | POA: Insufficient documentation

## 2015-08-21 DIAGNOSIS — R600 Localized edema: Secondary | ICD-10-CM | POA: Insufficient documentation

## 2015-08-21 DIAGNOSIS — M6281 Muscle weakness (generalized): Secondary | ICD-10-CM

## 2015-08-21 DIAGNOSIS — M79641 Pain in right hand: Secondary | ICD-10-CM | POA: Diagnosis not present

## 2015-08-21 DIAGNOSIS — M25572 Pain in left ankle and joints of left foot: Secondary | ICD-10-CM | POA: Insufficient documentation

## 2015-08-21 DIAGNOSIS — M25641 Stiffness of right hand, not elsewhere classified: Secondary | ICD-10-CM | POA: Insufficient documentation

## 2015-08-21 DIAGNOSIS — R262 Difficulty in walking, not elsewhere classified: Secondary | ICD-10-CM

## 2015-08-21 DIAGNOSIS — M25562 Pain in left knee: Secondary | ICD-10-CM | POA: Insufficient documentation

## 2015-08-21 DIAGNOSIS — R296 Repeated falls: Secondary | ICD-10-CM | POA: Insufficient documentation
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
## 2015-08-23 ENCOUNTER — Ambulatory Visit: Payer: Medicare Other | Admitting: Physical Therapy

## 2015-08-23 ENCOUNTER — Ambulatory Visit: Payer: Medicare Other | Admitting: Occupational Therapy

## 2015-08-23 DIAGNOSIS — M79662 Pain in left lower leg: Secondary | ICD-10-CM

## 2015-08-23 DIAGNOSIS — M25572 Pain in left ankle and joints of left foot: Secondary | ICD-10-CM

## 2015-08-23 DIAGNOSIS — M79641 Pain in right hand: Secondary | ICD-10-CM | POA: Diagnosis not present

## 2015-08-23 DIAGNOSIS — M25641 Stiffness of right hand, not elsewhere classified: Secondary | ICD-10-CM

## 2015-08-23 DIAGNOSIS — M25562 Pain in left knee: Secondary | ICD-10-CM

## 2015-08-23 DIAGNOSIS — M6281 Muscle weakness (generalized): Secondary | ICD-10-CM

## 2015-08-23 DIAGNOSIS — R600 Localized edema: Secondary | ICD-10-CM

## 2015-08-23 DIAGNOSIS — R262 Difficulty in walking, not elsewhere classified: Secondary | ICD-10-CM
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
## 2015-08-28 ENCOUNTER — Encounter: Payer: Self-pay | Admitting: Occupational Therapy

## 2015-08-28 ENCOUNTER — Ambulatory Visit: Payer: Medicare Other | Admitting: Physical Therapy

## 2015-08-28 ENCOUNTER — Ambulatory Visit: Payer: Medicare Other | Admitting: Occupational Therapy

## 2015-08-28 DIAGNOSIS — M79641 Pain in right hand: Secondary | ICD-10-CM | POA: Diagnosis not present

## 2015-08-28 DIAGNOSIS — R296 Repeated falls: Secondary | ICD-10-CM

## 2015-08-28 DIAGNOSIS — M79662 Pain in left lower leg: Secondary | ICD-10-CM

## 2015-08-28 DIAGNOSIS — R600 Localized edema: Secondary | ICD-10-CM

## 2015-08-28 DIAGNOSIS — M25641 Stiffness of right hand, not elsewhere classified: Secondary | ICD-10-CM

## 2015-08-28 DIAGNOSIS — R262 Difficulty in walking, not elsewhere classified: Secondary | ICD-10-CM
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
## 2015-08-30 ENCOUNTER — Ambulatory Visit: Payer: Medicare Other | Admitting: Occupational Therapy

## 2015-08-30 ENCOUNTER — Encounter: Payer: Self-pay | Admitting: Physical Therapy

## 2015-08-30 ENCOUNTER — Ambulatory Visit: Payer: Medicare Other | Admitting: Physical Therapy

## 2015-08-30 DIAGNOSIS — M79641 Pain in right hand: Secondary | ICD-10-CM

## 2015-08-30 DIAGNOSIS — M6281 Muscle weakness (generalized): Secondary | ICD-10-CM

## 2015-08-30 DIAGNOSIS — M25641 Stiffness of right hand, not elsewhere classified: Secondary | ICD-10-CM

## 2015-08-30 DIAGNOSIS — R262 Difficulty in walking, not elsewhere classified: Secondary | ICD-10-CM
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
## 2015-09-04 ENCOUNTER — Encounter: Payer: Self-pay | Admitting: Physical Therapy

## 2015-09-04 ENCOUNTER — Ambulatory Visit: Payer: Medicare Other | Admitting: Physical Therapy

## 2015-09-04 DIAGNOSIS — R600 Localized edema: Secondary | ICD-10-CM

## 2015-09-04 DIAGNOSIS — M79641 Pain in right hand: Secondary | ICD-10-CM

## 2015-09-04 DIAGNOSIS — R262 Difficulty in walking, not elsewhere classified: Secondary | ICD-10-CM

## 2015-09-04 DIAGNOSIS — M6281 Muscle weakness (generalized): Secondary | ICD-10-CM

## 2015-09-04 DIAGNOSIS — M79662 Pain in left lower leg: Secondary | ICD-10-CM

## 2015-09-04 DIAGNOSIS — M25641 Stiffness of right hand, not elsewhere classified: Secondary | ICD-10-CM
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
## 2015-09-06 ENCOUNTER — Ambulatory Visit: Payer: Medicare Other | Admitting: Physical Therapy

## 2015-09-11 ENCOUNTER — Ambulatory Visit: Payer: Medicare Other | Admitting: Physical Therapy

## 2015-09-11 DIAGNOSIS — R262 Difficulty in walking, not elsewhere classified: Secondary | ICD-10-CM

## 2015-09-11 DIAGNOSIS — M79662 Pain in left lower leg: Secondary | ICD-10-CM

## 2015-09-11 DIAGNOSIS — M6281 Muscle weakness (generalized): Secondary | ICD-10-CM

## 2015-09-11 DIAGNOSIS — R600 Localized edema: Secondary | ICD-10-CM

## 2015-09-11 DIAGNOSIS — M79641 Pain in right hand: Secondary | ICD-10-CM | POA: Diagnosis not present

## 2015-09-11 DIAGNOSIS — M25562 Pain in left knee: Secondary | ICD-10-CM

## 2015-09-11 DIAGNOSIS — R296 Repeated falls: Secondary | ICD-10-CM
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
## 2015-09-12 ENCOUNTER — Encounter (HOSPITAL_BASED_OUTPATIENT_CLINIC_OR_DEPARTMENT_OTHER): Payer: Medicare Other | Attending: Surgery

## 2015-09-12 DIAGNOSIS — E11621 Type 2 diabetes mellitus with foot ulcer: Secondary | ICD-10-CM | POA: Diagnosis present

## 2015-09-12 DIAGNOSIS — L97821 Non-pressure chronic ulcer of other part of left lower leg limited to breakdown of skin: Secondary | ICD-10-CM | POA: Insufficient documentation

## 2015-09-12 DIAGNOSIS — E1140 Type 2 diabetes mellitus with diabetic neuropathy, unspecified: Secondary | ICD-10-CM | POA: Diagnosis not present

## 2015-09-12 DIAGNOSIS — Z9119 Patient's noncompliance with other medical treatment and regimen: Secondary | ICD-10-CM | POA: Insufficient documentation

## 2015-09-13 ENCOUNTER — Encounter: Payer: Medicare Other | Admitting: Occupational Therapy

## 2015-09-13 ENCOUNTER — Ambulatory Visit: Payer: Medicare Other | Admitting: Physical Therapy

## 2015-09-13 DIAGNOSIS — R262 Difficulty in walking, not elsewhere classified: Secondary | ICD-10-CM

## 2015-09-13 DIAGNOSIS — M79662 Pain in left lower leg: Secondary | ICD-10-CM

## 2015-09-13 DIAGNOSIS — M6281 Muscle weakness (generalized): Secondary | ICD-10-CM

## 2015-09-13 DIAGNOSIS — R296 Repeated falls: Secondary | ICD-10-CM

## 2015-09-13 DIAGNOSIS — M79641 Pain in right hand: Secondary | ICD-10-CM | POA: Diagnosis not present

## 2015-09-13 DIAGNOSIS — R600 Localized edema: Secondary | ICD-10-CM

## 2015-09-13 DIAGNOSIS — M25562 Pain in left knee: Secondary | ICD-10-CM

## 2015-09-13 LAB — GLUCOSE, CAPILLARY: GLUCOSE-CAPILLARY: 121 mg/dL — AB (ref 65–99)
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
## 2015-09-14 ENCOUNTER — Encounter: Payer: Self-pay | Admitting: Occupational Therapy

## 2015-09-14 ENCOUNTER — Ambulatory Visit: Payer: Medicare Other | Admitting: Occupational Therapy

## 2015-09-14 DIAGNOSIS — M79641 Pain in right hand: Secondary | ICD-10-CM | POA: Diagnosis not present

## 2015-09-14 DIAGNOSIS — M25641 Stiffness of right hand, not elsewhere classified: Secondary | ICD-10-CM

## 2015-09-14 DIAGNOSIS — M6281 Muscle weakness (generalized): Secondary | ICD-10-CM
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
## 2015-09-18 ENCOUNTER — Ambulatory Visit: Payer: Medicare Other | Admitting: Physical Therapy

## 2015-09-18 ENCOUNTER — Encounter: Payer: Self-pay | Admitting: Physical Therapy

## 2015-09-18 DIAGNOSIS — M79662 Pain in left lower leg: Secondary | ICD-10-CM

## 2015-09-18 DIAGNOSIS — M25562 Pain in left knee: Secondary | ICD-10-CM

## 2015-09-18 DIAGNOSIS — M25641 Stiffness of right hand, not elsewhere classified: Secondary | ICD-10-CM

## 2015-09-18 DIAGNOSIS — R600 Localized edema: Secondary | ICD-10-CM

## 2015-09-18 DIAGNOSIS — R262 Difficulty in walking, not elsewhere classified: Secondary | ICD-10-CM

## 2015-09-18 DIAGNOSIS — R296 Repeated falls: Secondary | ICD-10-CM

## 2015-09-18 DIAGNOSIS — M79641 Pain in right hand: Secondary | ICD-10-CM | POA: Diagnosis not present

## 2015-09-18 DIAGNOSIS — M6281 Muscle weakness (generalized): Secondary | ICD-10-CM
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
## 2015-09-19 DIAGNOSIS — E11621 Type 2 diabetes mellitus with foot ulcer: Secondary | ICD-10-CM | POA: Diagnosis not present

## 2015-09-20 ENCOUNTER — Ambulatory Visit: Payer: Medicare Other | Admitting: Physical Therapy

## 2015-09-20 ENCOUNTER — Encounter: Payer: Self-pay | Admitting: Physical Therapy

## 2015-09-20 DIAGNOSIS — M79662 Pain in left lower leg: Secondary | ICD-10-CM

## 2015-09-20 DIAGNOSIS — M25562 Pain in left knee: Secondary | ICD-10-CM

## 2015-09-20 DIAGNOSIS — M79641 Pain in right hand: Secondary | ICD-10-CM | POA: Diagnosis not present

## 2015-09-20 DIAGNOSIS — R262 Difficulty in walking, not elsewhere classified: Secondary | ICD-10-CM

## 2015-09-20 DIAGNOSIS — M25572 Pain in left ankle and joints of left foot: Secondary | ICD-10-CM

## 2015-09-20 DIAGNOSIS — R296 Repeated falls: Secondary | ICD-10-CM

## 2015-09-20 DIAGNOSIS — M6281 Muscle weakness (generalized): Secondary | ICD-10-CM
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
## 2015-09-22 ENCOUNTER — Encounter (HOSPITAL_COMMUNITY): Payer: Self-pay | Admitting: Nurse Practitioner

## 2015-09-22 ENCOUNTER — Emergency Department (HOSPITAL_COMMUNITY)
Admission: EM | Admit: 2015-09-22 | Discharge: 2015-09-22 | Disposition: A | Discharge status: Home / Self Care | Payer: Medicare Other | Attending: Emergency Medicine | Admitting: Emergency Medicine

## 2015-09-22 DIAGNOSIS — M79604 Pain in right leg: Secondary | ICD-10-CM | POA: Diagnosis not present

## 2015-09-22 DIAGNOSIS — E119 Type 2 diabetes mellitus without complications: Secondary | ICD-10-CM | POA: Diagnosis not present

## 2015-09-22 DIAGNOSIS — Z87891 Personal history of nicotine dependence: Secondary | ICD-10-CM | POA: Diagnosis not present

## 2015-09-22 DIAGNOSIS — Z794 Long term (current) use of insulin: Secondary | ICD-10-CM | POA: Insufficient documentation

## 2015-09-22 DIAGNOSIS — J45909 Unspecified asthma, uncomplicated: Secondary | ICD-10-CM | POA: Insufficient documentation

## 2015-09-22 DIAGNOSIS — Z7984 Long term (current) use of oral hypoglycemic drugs: Secondary | ICD-10-CM | POA: Diagnosis not present

## 2015-09-22 DIAGNOSIS — Z79899 Other long term (current) drug therapy: Secondary | ICD-10-CM | POA: Diagnosis not present

## 2015-09-22 DIAGNOSIS — I10 Essential (primary) hypertension: Secondary | ICD-10-CM | POA: Diagnosis not present
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
## 2015-09-22 NOTE — Progress Notes (Signed)
Pt requested to speak with CSW. Pt wanted resources on primary medical providers for pain management. CSW encouraged pt to address her concerns with her primary provider and informed the pt that CSW will reach out to Care Management. CSW has reached out to CM regarding pt request.   Ernestina Columbiayiesha Sueann Brownley, Silverio LayLCSWA [facility] Clinical Social Worker [phone]

## 2015-09-22 NOTE — Discharge Instructions (Signed)
Take your tramadol as prescribed. Keep your scheduled appointment for physical therapy next week. If your pain is not well controlled with the tramadol prescribed ,Call your primary care physician or Dr. Eulah PontMurphy
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
## 2015-09-22 NOTE — ED Notes (Signed)
Pt provided with pcp resources and pain management resources by care management. Pt escorted via wheelchair to car by this RN.

## 2015-09-22 NOTE — ED Notes (Addendum)
Pt verbalized understanding of d/c instructions and has no further questions. Pt stable and NAD. Pt instructed by MD and this RN not to double her tramadol dose for pain. Pt is to follow up with PCP and Dr Eulah PontMurphy orthopedics. VSS, NAD. Pt speaking with social worker at this time about finding a new pcp and finding a pain management clinic.

## 2015-09-22 NOTE — ED Notes (Signed)
Pt is discharged, care management coming to see patient at this time.

## 2015-09-23 NOTE — Care Management Note (Signed)
Case Management Note  Patient Details  Name: Taylor [name] MRN: [id number] Date of Birth: [birth year]   Subjective/Objective:  45 y.o. F seen in the ED for complications related to LLE pain. CSW consulted CM for assistance with PCP as pt requesting referral to Pain management clinic. CM discussed steps toward procuring new PCP through www.[email or web address]://www.morris-vasquez.com/Medicare.gov, and/or calling Medicaid to ask for new PCP. Given Self pay resources with instructions to call and ask if any of listed MDs are accepting new patients. Pt very frustrated with "the system"                     Action/Plan: Anticipate discharge home today. No further CM needs but will be available should additional discharge needs arise.   Expected Discharge Date:                  Expected Discharge Plan:  Home/Self Care  In-House Referral:  Clinical Social Work  Discharge planning Services  CM Consult (PCP )  Post Acute Care Choice:  NA Choice offered to:  Patient  DME Arranged:  N/A DME Agency:  NA  HH Arranged:  NA HH Agency:  NA  Status of Service:  Completed, signed off  If discussed at Long Length of Stay Meetings, dates discussed:    Additional Comments:  Yvone NeuCrutchfield, Tadarrius Burch M, RN 09/23/2015, 9:40 AM

## 2015-09-25 ENCOUNTER — Ambulatory Visit: Payer: Medicare Other | Admitting: Physical Therapy

## 2015-09-26 ENCOUNTER — Encounter (HOSPITAL_BASED_OUTPATIENT_CLINIC_OR_DEPARTMENT_OTHER): Payer: Medicare Other | Attending: Surgery

## 2015-09-26 DIAGNOSIS — I10 Essential (primary) hypertension: Secondary | ICD-10-CM | POA: Insufficient documentation

## 2015-09-26 DIAGNOSIS — E11622 Type 2 diabetes mellitus with other skin ulcer: Secondary | ICD-10-CM | POA: Insufficient documentation

## 2015-09-26 DIAGNOSIS — E1140 Type 2 diabetes mellitus with diabetic neuropathy, unspecified: Secondary | ICD-10-CM | POA: Insufficient documentation

## 2015-09-26 DIAGNOSIS — L97821 Non-pressure chronic ulcer of other part of left lower leg limited to breakdown of skin: Secondary | ICD-10-CM | POA: Insufficient documentation

## 2015-09-27 ENCOUNTER — Encounter: Payer: Medicare Other | Admitting: Physical Therapy

## 2015-10-02 DIAGNOSIS — E11622 Type 2 diabetes mellitus with other skin ulcer: Secondary | ICD-10-CM | POA: Diagnosis present

## 2015-10-02 DIAGNOSIS — L97821 Non-pressure chronic ulcer of other part of left lower leg limited to breakdown of skin: Secondary | ICD-10-CM | POA: Diagnosis not present

## 2015-10-02 DIAGNOSIS — E1140 Type 2 diabetes mellitus with diabetic neuropathy, unspecified: Secondary | ICD-10-CM | POA: Diagnosis not present

## 2015-10-02 DIAGNOSIS — I10 Essential (primary) hypertension: Secondary | ICD-10-CM | POA: Diagnosis not present

## 2015-10-10 DIAGNOSIS — E11622 Type 2 diabetes mellitus with other skin ulcer: Secondary | ICD-10-CM | POA: Diagnosis not present

## 2015-10-17 DIAGNOSIS — E11622 Type 2 diabetes mellitus with other skin ulcer: Secondary | ICD-10-CM | POA: Diagnosis not present

## 2016-01-18 ENCOUNTER — Encounter: Payer: Medicare Other | Attending: Physical Medicine & Rehabilitation | Admitting: Physical Medicine & Rehabilitation

## 2016-01-18 ENCOUNTER — Encounter: Payer: Self-pay | Admitting: Physical Medicine & Rehabilitation

## 2016-01-18 VITALS — BP 153/104 | HR 103

## 2016-01-18 DIAGNOSIS — Z87891 Personal history of nicotine dependence: Secondary | ICD-10-CM | POA: Insufficient documentation

## 2016-01-18 DIAGNOSIS — M25521 Pain in right elbow: Secondary | ICD-10-CM | POA: Diagnosis present

## 2016-01-18 DIAGNOSIS — M79604 Pain in right leg: Secondary | ICD-10-CM | POA: Diagnosis not present

## 2016-01-18 DIAGNOSIS — Z9889 Other specified postprocedural states: Secondary | ICD-10-CM | POA: Insufficient documentation

## 2016-01-18 DIAGNOSIS — E119 Type 2 diabetes mellitus without complications: Secondary | ICD-10-CM | POA: Insufficient documentation

## 2016-01-18 DIAGNOSIS — M79641 Pain in right hand: Secondary | ICD-10-CM | POA: Insufficient documentation

## 2016-01-18 DIAGNOSIS — R269 Unspecified abnormalities of gait and mobility: Secondary | ICD-10-CM | POA: Insufficient documentation

## 2016-01-18 DIAGNOSIS — F419 Anxiety disorder, unspecified: Secondary | ICD-10-CM | POA: Diagnosis not present

## 2016-01-18 DIAGNOSIS — K219 Gastro-esophageal reflux disease without esophagitis: Secondary | ICD-10-CM | POA: Insufficient documentation

## 2016-01-18 DIAGNOSIS — G479 Sleep disorder, unspecified: Secondary | ICD-10-CM | POA: Diagnosis not present

## 2016-01-18 DIAGNOSIS — M25562 Pain in left knee: Secondary | ICD-10-CM

## 2016-01-18 DIAGNOSIS — M79605 Pain in left leg: Secondary | ICD-10-CM | POA: Diagnosis not present

## 2016-01-18 DIAGNOSIS — R200 Anesthesia of skin: Secondary | ICD-10-CM | POA: Diagnosis not present

## 2016-01-18 DIAGNOSIS — I10 Essential (primary) hypertension: Secondary | ICD-10-CM | POA: Insufficient documentation

## 2016-01-18 DIAGNOSIS — Z820 Family history of epilepsy and other diseases of the nervous system: Secondary | ICD-10-CM | POA: Diagnosis not present

## 2016-01-18 DIAGNOSIS — J45909 Unspecified asthma, uncomplicated: Secondary | ICD-10-CM | POA: Diagnosis not present

## 2016-01-18 DIAGNOSIS — F4323 Adjustment disorder with mixed anxiety and depressed mood: Secondary | ICD-10-CM

## 2016-01-18 DIAGNOSIS — R531 Weakness: Secondary | ICD-10-CM | POA: Diagnosis not present

## 2016-01-18 DIAGNOSIS — G894 Chronic pain syndrome: Secondary | ICD-10-CM

## 2016-01-18 DIAGNOSIS — M25561 Pain in right knee: Secondary | ICD-10-CM

## 2016-01-18 DIAGNOSIS — Z79899 Other long term (current) drug therapy: Secondary | ICD-10-CM

## 2016-01-18 DIAGNOSIS — Z833 Family history of diabetes mellitus: Secondary | ICD-10-CM | POA: Insufficient documentation

## 2016-01-18 DIAGNOSIS — Z5181 Encounter for therapeutic drug level monitoring: Secondary | ICD-10-CM

## 2016-01-18 MED ORDER — DICLOFENAC SODIUM 1 % TD GEL: 2.0000 g | Freq: Four times a day (QID) | TRANSDERMAL | 1 refills | Status: DC

## 2016-01-18 MED ORDER — AMITRIPTYLINE HCL 25 MG PO TABS: 25.0000 mg | ORAL_TABLET | Freq: Every day | ORAL | 1 refills | Status: DC

## 2016-01-18 MED ORDER — GABAPENTIN 600 MG PO TABS
600.0000 mg | ORAL_TABLET | Freq: Three times a day (TID) | ORAL | 1 refills | Status: DC
Start: 2016-01-18 — End: 2016-01-18

## 2016-01-18 MED ORDER — METHOCARBAMOL 500 MG PO TABS: 500.0000 mg | ORAL_TABLET | Freq: Three times a day (TID) | ORAL | 1 refills | Status: DC | PRN

## 2016-01-18 MED ORDER — GABAPENTIN 600 MG PO TABS: 600.0000 mg | ORAL_TABLET | Freq: Three times a day (TID) | ORAL | 1 refills | Status: DC

## 2016-01-18 NOTE — Addendum Note (Signed)
Addended by: Doreene ElandSHUMAKER, Sheryl Towell W on: 01/18/2016 10:21 AM   Modules accepted: Orders

## 2016-01-18 NOTE — Addendum Note (Signed)
Addended by: Doreene ElandSHUMAKER, SYBIL W on: 01/18/2016 11:12 AM   Modules accepted: Orders
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
## 2016-01-26 LAB — TOXASSURE SELECT,+ANTIDEPR,UR

## 2016-02-15 ENCOUNTER — Encounter: Payer: Medicare Other | Attending: Physical Medicine & Rehabilitation | Admitting: Physical Medicine & Rehabilitation

## 2016-02-15 ENCOUNTER — Encounter: Payer: Self-pay | Admitting: Physical Medicine & Rehabilitation

## 2016-02-15 VITALS — BP 127/86 | HR 80 | Resp 14

## 2016-02-15 DIAGNOSIS — R531 Weakness: Secondary | ICD-10-CM | POA: Insufficient documentation

## 2016-02-15 DIAGNOSIS — Z820 Family history of epilepsy and other diseases of the nervous system: Secondary | ICD-10-CM | POA: Insufficient documentation

## 2016-02-15 DIAGNOSIS — M25521 Pain in right elbow: Secondary | ICD-10-CM

## 2016-02-15 DIAGNOSIS — Z87891 Personal history of nicotine dependence: Secondary | ICD-10-CM | POA: Insufficient documentation

## 2016-02-15 DIAGNOSIS — M25561 Pain in right knee: Secondary | ICD-10-CM

## 2016-02-15 DIAGNOSIS — K219 Gastro-esophageal reflux disease without esophagitis: Secondary | ICD-10-CM | POA: Insufficient documentation

## 2016-02-15 DIAGNOSIS — J45909 Unspecified asthma, uncomplicated: Secondary | ICD-10-CM | POA: Insufficient documentation

## 2016-02-15 DIAGNOSIS — E119 Type 2 diabetes mellitus without complications: Secondary | ICD-10-CM | POA: Insufficient documentation

## 2016-02-15 DIAGNOSIS — G479 Sleep disorder, unspecified: Secondary | ICD-10-CM | POA: Insufficient documentation

## 2016-02-15 DIAGNOSIS — M79641 Pain in right hand: Secondary | ICD-10-CM

## 2016-02-15 DIAGNOSIS — I10 Essential (primary) hypertension: Secondary | ICD-10-CM | POA: Insufficient documentation

## 2016-02-15 DIAGNOSIS — G894 Chronic pain syndrome: Secondary | ICD-10-CM

## 2016-02-15 DIAGNOSIS — Z9889 Other specified postprocedural states: Secondary | ICD-10-CM | POA: Insufficient documentation

## 2016-02-15 DIAGNOSIS — R269 Unspecified abnormalities of gait and mobility: Secondary | ICD-10-CM

## 2016-02-15 DIAGNOSIS — M79604 Pain in right leg: Secondary | ICD-10-CM | POA: Diagnosis not present

## 2016-02-15 DIAGNOSIS — M79605 Pain in left leg: Secondary | ICD-10-CM | POA: Insufficient documentation

## 2016-02-15 DIAGNOSIS — R200 Anesthesia of skin: Secondary | ICD-10-CM | POA: Insufficient documentation

## 2016-02-15 DIAGNOSIS — R5383 Other fatigue: Secondary | ICD-10-CM

## 2016-02-15 DIAGNOSIS — Z833 Family history of diabetes mellitus: Secondary | ICD-10-CM | POA: Insufficient documentation

## 2016-02-15 DIAGNOSIS — Z5181 Encounter for therapeutic drug level monitoring: Secondary | ICD-10-CM | POA: Diagnosis not present

## 2016-02-15 DIAGNOSIS — F4323 Adjustment disorder with mixed anxiety and depressed mood: Secondary | ICD-10-CM

## 2016-02-15 DIAGNOSIS — Z79899 Other long term (current) drug therapy: Secondary | ICD-10-CM | POA: Diagnosis not present

## 2016-02-15 DIAGNOSIS — M25562 Pain in left knee: Secondary | ICD-10-CM | POA: Diagnosis not present

## 2016-02-15 DIAGNOSIS — F419 Anxiety disorder, unspecified: Secondary | ICD-10-CM | POA: Diagnosis not present

## 2016-02-15 MED ORDER — AMITRIPTYLINE HCL 10 MG PO TABS: 10.0000 mg | ORAL_TABLET | Freq: Every day | ORAL | 1 refills | Status: DC

## 2016-02-15 MED ORDER — GABAPENTIN 300 MG PO CAPS: 300.0000 mg | ORAL_CAPSULE | Freq: Three times a day (TID) | ORAL | 1 refills | Status: DC

## 2016-02-15 MED ORDER — METHOCARBAMOL 500 MG PO TABS: 500.0000 mg | ORAL_TABLET | Freq: Two times a day (BID) | ORAL | 1 refills | Status: DC | PRN

## 2016-02-15 NOTE — Addendum Note (Signed)
Addended by: Barbee ShropshireBRIGHT, BRUCE B on: 02/15/2016 04:15 PM   Modules accepted: Orders
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
## 2016-02-22 LAB — TOXASSURE SELECT,+ANTIDEPR,UR

## 2016-02-28 NOTE — Progress Notes (Signed)
Urine drug screen for this encounter is consistent for prescribed medication 

## 2016-03-03 ENCOUNTER — Ambulatory Visit: Payer: Medicare Other | Attending: Physical Medicine & Rehabilitation | Admitting: Rehabilitation

## 2016-03-03 DIAGNOSIS — M79605 Pain in left leg: Secondary | ICD-10-CM | POA: Diagnosis present

## 2016-03-03 DIAGNOSIS — R2689 Other abnormalities of gait and mobility: Secondary | ICD-10-CM | POA: Diagnosis present

## 2016-03-03 DIAGNOSIS — M6281 Muscle weakness (generalized): Secondary | ICD-10-CM

## 2016-03-03 DIAGNOSIS — R2681 Unsteadiness on feet: Secondary | ICD-10-CM | POA: Diagnosis present

## 2016-03-03 DIAGNOSIS — M25572 Pain in left ankle and joints of left foot: Secondary | ICD-10-CM | POA: Diagnosis present
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
## 2016-03-13 ENCOUNTER — Encounter: Payer: Self-pay | Admitting: Rehabilitation

## 2016-03-13 ENCOUNTER — Other Ambulatory Visit: Payer: Self-pay | Admitting: Physical Medicine & Rehabilitation

## 2016-03-13 ENCOUNTER — Ambulatory Visit: Payer: Medicare Other | Admitting: Rehabilitation

## 2016-03-13 DIAGNOSIS — M6281 Muscle weakness (generalized): Secondary | ICD-10-CM

## 2016-03-13 DIAGNOSIS — M79605 Pain in left leg: Secondary | ICD-10-CM

## 2016-03-13 DIAGNOSIS — R2681 Unsteadiness on feet: Secondary | ICD-10-CM

## 2016-03-13 DIAGNOSIS — R2689 Other abnormalities of gait and mobility: Secondary | ICD-10-CM

## 2016-03-13 NOTE — Patient Instructions (Signed)
Functional Quadriceps: Sit to Stand    Sit on edge of chair, feet flat on floor. Stand upright, extending knees fully. Repeat __10__ times per set. Do __1__ sets per session. Do _1-2___ sessions per day.  http://orth.exer.us/735   Copyright  VHI. All rights reserved.   Toe / Heel Raise    Gently rock back on heels and raise toes. Then rock forward on toes and raise heels. Use counter top for support or walker.  Repeat sequence _10___ times per session. Do _1-2___ sessions per week.  Copyright  VHI. All rights reserved.   WALKING  Walking is a great form of exercise to increase your strength, endurance and overall fitness.  A walking program can help you start slowly and gradually build endurance as you go.  Everyone's ability is different, so each person's starting point will be different.  You do not have to follow them exactly.  The are just samples. You should simply find out what's right for you and stick to that program.   In the beginning, you'll start off walking 2-3 times a day for short distances.  As you get stronger, you'll be walking further at just 1-2 times per day.  A. You Can Walk For A Certain Length Of Time Each Day    Walk 5 minutes 2-3 times per day.  Increase 1 minutes every 7 days (3 times per day).  Work up to 25-30 minutes (1-2 times per day).   Example:   Day 1-2 5 minutes 3 times per day   Day 7-8 5 minutes 2-3 times per day   Day 13-14 6-7 minutes 1-2 times per day  B. You Can Walk For a Certain Distance Each Day     Distance can be substituted for time.    Example:   3 trips to mailbox (at road)   3 trips to corner of block   3 trips around the block  C. Go to local [employer] and use the track.    Walk for distance ____ around track  Or time ____ minutes  Please only do the exercises that your therapist has initialed and dated
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
## 2016-03-14 ENCOUNTER — Encounter: Payer: Self-pay | Admitting: Physical Medicine & Rehabilitation

## 2016-03-14 ENCOUNTER — Encounter: Payer: Medicare Other | Attending: Physical Medicine & Rehabilitation | Admitting: Physical Medicine & Rehabilitation

## 2016-03-14 VITALS — BP 118/81 | HR 96 | Resp 14

## 2016-03-14 DIAGNOSIS — M25561 Pain in right knee: Secondary | ICD-10-CM | POA: Diagnosis not present

## 2016-03-14 DIAGNOSIS — Z9889 Other specified postprocedural states: Secondary | ICD-10-CM | POA: Insufficient documentation

## 2016-03-14 DIAGNOSIS — I10 Essential (primary) hypertension: Secondary | ICD-10-CM | POA: Diagnosis not present

## 2016-03-14 DIAGNOSIS — R531 Weakness: Secondary | ICD-10-CM | POA: Diagnosis not present

## 2016-03-14 DIAGNOSIS — F419 Anxiety disorder, unspecified: Secondary | ICD-10-CM | POA: Insufficient documentation

## 2016-03-14 DIAGNOSIS — Z87891 Personal history of nicotine dependence: Secondary | ICD-10-CM | POA: Insufficient documentation

## 2016-03-14 DIAGNOSIS — J45909 Unspecified asthma, uncomplicated: Secondary | ICD-10-CM | POA: Insufficient documentation

## 2016-03-14 DIAGNOSIS — G894 Chronic pain syndrome: Secondary | ICD-10-CM

## 2016-03-14 DIAGNOSIS — F4323 Adjustment disorder with mixed anxiety and depressed mood: Secondary | ICD-10-CM | POA: Diagnosis not present

## 2016-03-14 DIAGNOSIS — E119 Type 2 diabetes mellitus without complications: Secondary | ICD-10-CM | POA: Diagnosis not present

## 2016-03-14 DIAGNOSIS — M25562 Pain in left knee: Secondary | ICD-10-CM

## 2016-03-14 DIAGNOSIS — Z820 Family history of epilepsy and other diseases of the nervous system: Secondary | ICD-10-CM | POA: Insufficient documentation

## 2016-03-14 DIAGNOSIS — R269 Unspecified abnormalities of gait and mobility: Secondary | ICD-10-CM | POA: Diagnosis not present

## 2016-03-14 DIAGNOSIS — M79605 Pain in left leg: Secondary | ICD-10-CM | POA: Diagnosis not present

## 2016-03-14 DIAGNOSIS — M79604 Pain in right leg: Secondary | ICD-10-CM | POA: Insufficient documentation

## 2016-03-14 DIAGNOSIS — Z833 Family history of diabetes mellitus: Secondary | ICD-10-CM | POA: Diagnosis not present

## 2016-03-14 DIAGNOSIS — M79641 Pain in right hand: Secondary | ICD-10-CM | POA: Diagnosis not present

## 2016-03-14 DIAGNOSIS — M25521 Pain in right elbow: Secondary | ICD-10-CM | POA: Diagnosis present

## 2016-03-14 DIAGNOSIS — G479 Sleep disorder, unspecified: Secondary | ICD-10-CM | POA: Insufficient documentation

## 2016-03-14 DIAGNOSIS — K219 Gastro-esophageal reflux disease without esophagitis: Secondary | ICD-10-CM | POA: Diagnosis not present

## 2016-03-14 DIAGNOSIS — R200 Anesthesia of skin: Secondary | ICD-10-CM | POA: Diagnosis not present

## 2016-03-14 MED ORDER — DICLOFENAC SODIUM 1 % TD GEL: 2.0000 g | Freq: Four times a day (QID) | TRANSDERMAL | 1 refills | Status: DC

## 2016-03-14 MED ORDER — TRAMADOL HCL 50 MG PO TABS
100.0000 mg | ORAL_TABLET | Freq: Three times a day (TID) | ORAL | 0 refills | Status: DC | PRN
Start: 2016-03-14 — End: 2016-05-15

## 2016-03-14 MED ORDER — NORTRIPTYLINE HCL 25 MG PO CAPS: 25.0000 mg | ORAL_CAPSULE | Freq: Every day | ORAL | 1 refills | Status: DC

## 2016-03-14 MED ORDER — TAPENTADOL HCL 50 MG PO TABS: 50.0000 mg | ORAL_TABLET | Freq: Two times a day (BID) | ORAL | 0 refills | Status: AC | PRN
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
## 2016-03-18 ENCOUNTER — Telehealth: Payer: Self-pay | Admitting: *Deleted

## 2016-03-18 NOTE — Telephone Encounter (Signed)
[facility] Nucynta 50 mg bid.

## 2016-03-20 ENCOUNTER — Ambulatory Visit: Payer: Medicare Other | Admitting: Rehabilitation

## 2016-03-21 ENCOUNTER — Telehealth: Payer: Self-pay | Admitting: *Deleted

## 2016-03-27 ENCOUNTER — Ambulatory Visit: Payer: Medicare Other | Admitting: Rehabilitation

## 2016-03-27 NOTE — Telephone Encounter (Signed)
Prior auth for Nucynta was denied by insurance.

## 2016-03-28 NOTE — Telephone Encounter (Signed)
Appeal letter faxed to [employer]  Fax # [phone]

## 2016-04-03 ENCOUNTER — Ambulatory Visit: Payer: Medicare Other | Attending: Physical Medicine & Rehabilitation | Admitting: Rehabilitation

## 2016-04-03 DIAGNOSIS — M6281 Muscle weakness (generalized): Secondary | ICD-10-CM

## 2016-04-03 NOTE — Therapy (Signed)
[facility] [address] [place], [place], [address] Phone: [phone]   Fax:  [phone]  Patient Details  Name: Taylor [name] MRN: [id number] Date of Birth: 14-Feb-1970 Referring Provider:  Verlon AuBoyd, Tammy Lamonica, MD  Encounter Date: 04/03/2016   Note pt arrived to session approx 15 mins late and "wanting to speak with PT."  She verbalized that she has [name] unable to obtain pain medication from MD and therefore feels that she cannot continue PT at this time.  PT to place pt on hold at this time until she feels that she can continue.  Discussed if it was outside of a month or more that she may need to get another order.  Pt verbalized understanding.     Harriet ButteEmily Tobe Kervin, PT, MPT [facility] [address] [place], [place], [address] Phone: [phone]   Fax:  [phone] 04/03/16, 3:11 PM

## 2016-04-04 NOTE — Telephone Encounter (Addendum)
Nucynta approved 03/21/16-01/19/17 Authorization number [id number]  Message left for Ms Taylor [name].

## 2016-04-10 ENCOUNTER — Telehealth: Payer: Self-pay | Admitting: *Deleted

## 2016-04-10 ENCOUNTER — Ambulatory Visit: Payer: Medicare Other | Admitting: Rehabilitation

## 2016-04-10 NOTE — Telephone Encounter (Signed)
Taylor [name] called about her medications. Her speech was thick and hard to understand.  She had received the call about the Nucynta being approved.  Asking about Rx.  I told her we did not have it, it was given to her at last appt with Dr Allena KatzPatel, and she should check with pharmacy.  She is asking about voltaren gel approval.  I will check on that.

## 2016-04-10 NOTE — Telephone Encounter (Signed)
Prior auth on Voltaren gel 1% generic Approved today  Request Reference Number: [id number]. DICLOFENAC GEL 1% is approved through 01/19/2017. For further questions, call [phone].

## 2016-04-11 ENCOUNTER — Encounter: Payer: Medicare Other | Admitting: Physical Medicine & Rehabilitation

## 2016-04-17 ENCOUNTER — Ambulatory Visit: Payer: Medicare Other | Admitting: Rehabilitation

## 2016-04-24 ENCOUNTER — Ambulatory Visit: Payer: Medicare Other | Admitting: Rehabilitation

## 2016-05-01 ENCOUNTER — Encounter: Payer: Medicare Other | Admitting: Physical Medicine & Rehabilitation

## 2016-05-01 ENCOUNTER — Ambulatory Visit: Payer: Medicare Other | Admitting: Rehabilitation

## 2016-05-10 ENCOUNTER — Other Ambulatory Visit: Payer: Self-pay | Admitting: Physical Medicine & Rehabilitation

## 2016-05-14 ENCOUNTER — Other Ambulatory Visit: Payer: Self-pay

## 2016-05-14 MED ORDER — NORTRIPTYLINE HCL 25 MG PO CAPS: 25.0000 mg | ORAL_CAPSULE | Freq: Every day | ORAL | 1 refills | Status: DC

## 2016-05-15 ENCOUNTER — Encounter: Payer: Self-pay | Admitting: Physical Medicine & Rehabilitation

## 2016-05-15 ENCOUNTER — Encounter: Payer: Medicare Other | Attending: Physical Medicine & Rehabilitation | Admitting: Physical Medicine & Rehabilitation

## 2016-05-15 VITALS — BP 124/87 | HR 88

## 2016-05-15 DIAGNOSIS — Z87891 Personal history of nicotine dependence: Secondary | ICD-10-CM | POA: Insufficient documentation

## 2016-05-15 DIAGNOSIS — K219 Gastro-esophageal reflux disease without esophagitis: Secondary | ICD-10-CM | POA: Diagnosis not present

## 2016-05-15 DIAGNOSIS — Z820 Family history of epilepsy and other diseases of the nervous system: Secondary | ICD-10-CM | POA: Insufficient documentation

## 2016-05-15 DIAGNOSIS — E119 Type 2 diabetes mellitus without complications: Secondary | ICD-10-CM | POA: Insufficient documentation

## 2016-05-15 DIAGNOSIS — G479 Sleep disorder, unspecified: Secondary | ICD-10-CM | POA: Diagnosis not present

## 2016-05-15 DIAGNOSIS — G894 Chronic pain syndrome: Secondary | ICD-10-CM

## 2016-05-15 DIAGNOSIS — M79604 Pain in right leg: Secondary | ICD-10-CM | POA: Diagnosis not present

## 2016-05-15 DIAGNOSIS — J45909 Unspecified asthma, uncomplicated: Secondary | ICD-10-CM | POA: Insufficient documentation

## 2016-05-15 DIAGNOSIS — M79641 Pain in right hand: Secondary | ICD-10-CM | POA: Diagnosis not present

## 2016-05-15 DIAGNOSIS — R200 Anesthesia of skin: Secondary | ICD-10-CM | POA: Insufficient documentation

## 2016-05-15 DIAGNOSIS — M25521 Pain in right elbow: Secondary | ICD-10-CM | POA: Insufficient documentation

## 2016-05-15 DIAGNOSIS — M25561 Pain in right knee: Secondary | ICD-10-CM

## 2016-05-15 DIAGNOSIS — R531 Weakness: Secondary | ICD-10-CM | POA: Diagnosis not present

## 2016-05-15 DIAGNOSIS — M79605 Pain in left leg: Secondary | ICD-10-CM | POA: Diagnosis not present

## 2016-05-15 DIAGNOSIS — R269 Unspecified abnormalities of gait and mobility: Secondary | ICD-10-CM | POA: Insufficient documentation

## 2016-05-15 DIAGNOSIS — Z9889 Other specified postprocedural states: Secondary | ICD-10-CM | POA: Diagnosis not present

## 2016-05-15 DIAGNOSIS — F419 Anxiety disorder, unspecified: Secondary | ICD-10-CM | POA: Diagnosis not present

## 2016-05-15 DIAGNOSIS — M25562 Pain in left knee: Secondary | ICD-10-CM

## 2016-05-15 DIAGNOSIS — Z833 Family history of diabetes mellitus: Secondary | ICD-10-CM | POA: Insufficient documentation

## 2016-05-15 DIAGNOSIS — F4323 Adjustment disorder with mixed anxiety and depressed mood: Secondary | ICD-10-CM

## 2016-05-15 DIAGNOSIS — I10 Essential (primary) hypertension: Secondary | ICD-10-CM | POA: Insufficient documentation

## 2016-05-15 MED ORDER — DICLOFENAC SODIUM 1 % TD GEL: 2.0000 g | Freq: Four times a day (QID) | TRANSDERMAL | 1 refills | Status: DC

## 2016-05-15 MED ORDER — TAPENTADOL HCL 50 MG PO TABS: 50.0000 mg | ORAL_TABLET | Freq: Three times a day (TID) | ORAL | 0 refills | Status: DC | PRN

## 2016-05-15 MED ORDER — GABAPENTIN 300 MG PO CAPS: 300.0000 mg | ORAL_CAPSULE | Freq: Three times a day (TID) | ORAL | 1 refills | Status: DC
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
## 2016-06-12 ENCOUNTER — Encounter: Payer: Medicare Other | Attending: Physical Medicine & Rehabilitation | Admitting: Physical Medicine & Rehabilitation

## 2016-06-12 ENCOUNTER — Encounter: Payer: Self-pay | Admitting: Physical Medicine & Rehabilitation

## 2016-06-12 VITALS — BP 151/107 | HR 109 | Resp 14

## 2016-06-12 DIAGNOSIS — M79604 Pain in right leg: Secondary | ICD-10-CM | POA: Diagnosis not present

## 2016-06-12 DIAGNOSIS — M79605 Pain in left leg: Secondary | ICD-10-CM | POA: Diagnosis not present

## 2016-06-12 DIAGNOSIS — J45909 Unspecified asthma, uncomplicated: Secondary | ICD-10-CM | POA: Insufficient documentation

## 2016-06-12 DIAGNOSIS — R200 Anesthesia of skin: Secondary | ICD-10-CM | POA: Insufficient documentation

## 2016-06-12 DIAGNOSIS — I10 Essential (primary) hypertension: Secondary | ICD-10-CM | POA: Diagnosis not present

## 2016-06-12 DIAGNOSIS — K219 Gastro-esophageal reflux disease without esophagitis: Secondary | ICD-10-CM | POA: Diagnosis not present

## 2016-06-12 DIAGNOSIS — M25521 Pain in right elbow: Secondary | ICD-10-CM | POA: Insufficient documentation

## 2016-06-12 DIAGNOSIS — Z833 Family history of diabetes mellitus: Secondary | ICD-10-CM | POA: Diagnosis not present

## 2016-06-12 DIAGNOSIS — Z9889 Other specified postprocedural states: Secondary | ICD-10-CM | POA: Insufficient documentation

## 2016-06-12 DIAGNOSIS — R5383 Other fatigue: Secondary | ICD-10-CM

## 2016-06-12 DIAGNOSIS — R269 Unspecified abnormalities of gait and mobility: Secondary | ICD-10-CM | POA: Insufficient documentation

## 2016-06-12 DIAGNOSIS — M79641 Pain in right hand: Secondary | ICD-10-CM | POA: Insufficient documentation

## 2016-06-12 DIAGNOSIS — G479 Sleep disorder, unspecified: Secondary | ICD-10-CM | POA: Insufficient documentation

## 2016-06-12 DIAGNOSIS — Z820 Family history of epilepsy and other diseases of the nervous system: Secondary | ICD-10-CM | POA: Diagnosis not present

## 2016-06-12 DIAGNOSIS — F419 Anxiety disorder, unspecified: Secondary | ICD-10-CM | POA: Diagnosis not present

## 2016-06-12 DIAGNOSIS — Z87891 Personal history of nicotine dependence: Secondary | ICD-10-CM | POA: Diagnosis not present

## 2016-06-12 DIAGNOSIS — M25561 Pain in right knee: Secondary | ICD-10-CM | POA: Diagnosis not present

## 2016-06-12 DIAGNOSIS — R531 Weakness: Secondary | ICD-10-CM | POA: Diagnosis not present

## 2016-06-12 DIAGNOSIS — E119 Type 2 diabetes mellitus without complications: Secondary | ICD-10-CM | POA: Insufficient documentation

## 2016-06-12 DIAGNOSIS — M25562 Pain in left knee: Secondary | ICD-10-CM

## 2016-06-12 DIAGNOSIS — F4323 Adjustment disorder with mixed anxiety and depressed mood: Secondary | ICD-10-CM

## 2016-06-12 DIAGNOSIS — G894 Chronic pain syndrome: Secondary | ICD-10-CM

## 2016-06-12 MED ORDER — LIDOCAINE 5 % EX PTCH: 1.0000 | MEDICATED_PATCH | CUTANEOUS | 1 refills | Status: AC

## 2016-06-12 MED ORDER — DICLOFENAC SODIUM 1 % TD GEL: 2.0000 g | Freq: Four times a day (QID) | TRANSDERMAL | 1 refills | Status: DC

## 2016-06-12 MED ORDER — GABAPENTIN 600 MG PO TABS: 600.0000 mg | ORAL_TABLET | Freq: Three times a day (TID) | ORAL | 1 refills | Status: DC

## 2016-06-12 MED ORDER — TAPENTADOL HCL 50 MG PO TABS: 50.0000 mg | ORAL_TABLET | Freq: Three times a day (TID) | ORAL | 0 refills | Status: DC | PRN
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
## 2016-06-12 NOTE — Addendum Note (Signed)
Addended by: Maryla MorrowPATEL, Leroy Pettway A on: 06/12/2016 02:09 PM   Modules accepted: Orders

## 2016-06-18 LAB — TOXASSURE SELECT,+ANTIDEPR,UR

## 2016-06-20 ENCOUNTER — Telehealth: Payer: Self-pay | Admitting: *Deleted

## 2016-06-20 NOTE — Telephone Encounter (Signed)
Urine drug screen for this encounter is consistent for prescribed medication 

## 2016-07-14 ENCOUNTER — Encounter: Payer: Self-pay | Admitting: Rehabilitation

## 2016-07-14 DIAGNOSIS — R200 Anesthesia of skin: Secondary | ICD-10-CM | POA: Insufficient documentation

## 2016-07-14 DIAGNOSIS — Z9889 Other specified postprocedural states: Secondary | ICD-10-CM | POA: Insufficient documentation

## 2016-07-14 DIAGNOSIS — Z87891 Personal history of nicotine dependence: Secondary | ICD-10-CM | POA: Insufficient documentation

## 2016-07-14 DIAGNOSIS — J45909 Unspecified asthma, uncomplicated: Secondary | ICD-10-CM | POA: Insufficient documentation

## 2016-07-14 DIAGNOSIS — F419 Anxiety disorder, unspecified: Secondary | ICD-10-CM | POA: Insufficient documentation

## 2016-07-14 DIAGNOSIS — K219 Gastro-esophageal reflux disease without esophagitis: Secondary | ICD-10-CM | POA: Insufficient documentation

## 2016-07-14 DIAGNOSIS — R531 Weakness: Secondary | ICD-10-CM | POA: Insufficient documentation

## 2016-07-14 DIAGNOSIS — Z833 Family history of diabetes mellitus: Secondary | ICD-10-CM | POA: Insufficient documentation

## 2016-07-14 DIAGNOSIS — M25521 Pain in right elbow: Secondary | ICD-10-CM | POA: Insufficient documentation

## 2016-07-14 DIAGNOSIS — Z820 Family history of epilepsy and other diseases of the nervous system: Secondary | ICD-10-CM | POA: Insufficient documentation

## 2016-07-14 DIAGNOSIS — G479 Sleep disorder, unspecified: Secondary | ICD-10-CM | POA: Insufficient documentation

## 2016-07-14 DIAGNOSIS — M79605 Pain in left leg: Secondary | ICD-10-CM | POA: Insufficient documentation

## 2016-07-14 DIAGNOSIS — I10 Essential (primary) hypertension: Secondary | ICD-10-CM | POA: Insufficient documentation

## 2016-07-14 DIAGNOSIS — R269 Unspecified abnormalities of gait and mobility: Secondary | ICD-10-CM | POA: Insufficient documentation

## 2016-07-14 DIAGNOSIS — M79604 Pain in right leg: Secondary | ICD-10-CM | POA: Insufficient documentation

## 2016-07-14 DIAGNOSIS — E119 Type 2 diabetes mellitus without complications: Secondary | ICD-10-CM | POA: Insufficient documentation

## 2016-07-14 DIAGNOSIS — M79641 Pain in right hand: Secondary | ICD-10-CM | POA: Insufficient documentation
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
## 2016-08-06 ENCOUNTER — Other Ambulatory Visit: Payer: Self-pay | Admitting: Physical Medicine & Rehabilitation

## 2016-08-14 ENCOUNTER — Encounter: Payer: Medicare Other | Attending: Physical Medicine & Rehabilitation | Admitting: Physical Medicine & Rehabilitation

## 2016-08-14 DIAGNOSIS — M25521 Pain in right elbow: Secondary | ICD-10-CM | POA: Insufficient documentation

## 2016-08-14 DIAGNOSIS — G479 Sleep disorder, unspecified: Secondary | ICD-10-CM | POA: Insufficient documentation

## 2016-08-14 DIAGNOSIS — E119 Type 2 diabetes mellitus without complications: Secondary | ICD-10-CM | POA: Insufficient documentation

## 2016-08-14 DIAGNOSIS — R200 Anesthesia of skin: Secondary | ICD-10-CM | POA: Insufficient documentation

## 2016-08-14 DIAGNOSIS — Z820 Family history of epilepsy and other diseases of the nervous system: Secondary | ICD-10-CM | POA: Insufficient documentation

## 2016-08-14 DIAGNOSIS — J45909 Unspecified asthma, uncomplicated: Secondary | ICD-10-CM | POA: Insufficient documentation

## 2016-08-14 DIAGNOSIS — R269 Unspecified abnormalities of gait and mobility: Secondary | ICD-10-CM | POA: Insufficient documentation

## 2016-08-14 DIAGNOSIS — Z9889 Other specified postprocedural states: Secondary | ICD-10-CM | POA: Insufficient documentation

## 2016-08-14 DIAGNOSIS — Z833 Family history of diabetes mellitus: Secondary | ICD-10-CM | POA: Insufficient documentation

## 2016-08-14 DIAGNOSIS — Z87891 Personal history of nicotine dependence: Secondary | ICD-10-CM | POA: Insufficient documentation

## 2016-08-14 DIAGNOSIS — I10 Essential (primary) hypertension: Secondary | ICD-10-CM | POA: Insufficient documentation

## 2016-08-14 DIAGNOSIS — M79641 Pain in right hand: Secondary | ICD-10-CM | POA: Insufficient documentation

## 2016-08-14 DIAGNOSIS — R531 Weakness: Secondary | ICD-10-CM | POA: Insufficient documentation

## 2016-08-14 DIAGNOSIS — M79604 Pain in right leg: Secondary | ICD-10-CM | POA: Insufficient documentation

## 2016-08-14 DIAGNOSIS — M79605 Pain in left leg: Secondary | ICD-10-CM | POA: Insufficient documentation

## 2016-08-14 DIAGNOSIS — K219 Gastro-esophageal reflux disease without esophagitis: Secondary | ICD-10-CM | POA: Insufficient documentation

## 2016-08-14 DIAGNOSIS — F419 Anxiety disorder, unspecified: Secondary | ICD-10-CM | POA: Insufficient documentation

## 2016-10-12 ENCOUNTER — Other Ambulatory Visit: Payer: Self-pay | Admitting: Physical Medicine & Rehabilitation

## 2016-11-12 ENCOUNTER — Other Ambulatory Visit (HOSPITAL_BASED_OUTPATIENT_CLINIC_OR_DEPARTMENT_OTHER): Payer: Self-pay

## 2016-11-12 DIAGNOSIS — G4730 Sleep apnea, unspecified: Secondary | ICD-10-CM

## 2016-11-12 DIAGNOSIS — R5383 Other fatigue: Secondary | ICD-10-CM

## 2016-11-12 DIAGNOSIS — G4710 Hypersomnia, unspecified: Secondary | ICD-10-CM

## 2016-11-12 DIAGNOSIS — R0683 Snoring: Secondary | ICD-10-CM

## 2016-11-12 DIAGNOSIS — R454 Irritability and anger: Secondary | ICD-10-CM

## 2016-12-02 ENCOUNTER — Encounter (HOSPITAL_BASED_OUTPATIENT_CLINIC_OR_DEPARTMENT_OTHER): Payer: Medicare Other

## 2016-12-03 ENCOUNTER — Ambulatory Visit (HOSPITAL_COMMUNITY)
Admission: EM | Admit: 2016-12-03 | Discharge: 2016-12-03 | Disposition: A | Discharge status: Home / Self Care | Payer: Medicare Other | Attending: Family Medicine | Admitting: Family Medicine

## 2016-12-03 ENCOUNTER — Other Ambulatory Visit: Payer: Self-pay

## 2016-12-03 ENCOUNTER — Encounter (HOSPITAL_COMMUNITY): Payer: Self-pay | Admitting: Emergency Medicine

## 2016-12-03 DIAGNOSIS — S0502XA Injury of conjunctiva and corneal abrasion without foreign body, left eye, initial encounter: Secondary | ICD-10-CM

## 2016-12-03 DIAGNOSIS — T1592XA Foreign body on external eye, part unspecified, left eye, initial encounter: Secondary | ICD-10-CM

## 2016-12-03 MED ORDER — FLUORESCEIN SODIUM 0.6 MG OP STRP
ORAL_STRIP | OPHTHALMIC | Status: AC
  Filled 2016-12-03: qty 1

## 2016-12-03 MED ORDER — TETRACAINE HCL 0.5 % OP SOLN
OPHTHALMIC | Status: AC
  Filled 2016-12-03: qty 4

## 2016-12-03 NOTE — Discharge Instructions (Signed)
Return for any recurrence of symptoms.

## 2016-12-03 NOTE — ED Triage Notes (Signed)
Woke this morning with left eye pain and swelling.  Felt like sand in eye, blurry.  No improvement throughout the day.
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
## 2016-12-03 NOTE — ED Notes (Signed)
No answer

## 2016-12-04 ENCOUNTER — Ambulatory Visit (HOSPITAL_BASED_OUTPATIENT_CLINIC_OR_DEPARTMENT_OTHER): Payer: Medicare Other | Attending: Nurse Practitioner | Admitting: Internal Medicine

## 2016-12-04 VITALS — Ht 65.0 in | Wt 197.0 lb

## 2016-12-04 DIAGNOSIS — R5383 Other fatigue: Secondary | ICD-10-CM

## 2016-12-04 DIAGNOSIS — I10 Essential (primary) hypertension: Secondary | ICD-10-CM | POA: Insufficient documentation

## 2016-12-04 DIAGNOSIS — G4730 Sleep apnea, unspecified: Secondary | ICD-10-CM

## 2016-12-04 DIAGNOSIS — R0683 Snoring: Secondary | ICD-10-CM | POA: Diagnosis present

## 2016-12-04 DIAGNOSIS — Z6833 Body mass index (BMI) 33.0-33.9, adult: Secondary | ICD-10-CM | POA: Diagnosis not present

## 2016-12-04 DIAGNOSIS — E119 Type 2 diabetes mellitus without complications: Secondary | ICD-10-CM | POA: Insufficient documentation

## 2016-12-04 DIAGNOSIS — G4710 Hypersomnia, unspecified: Secondary | ICD-10-CM | POA: Insufficient documentation

## 2016-12-04 DIAGNOSIS — R0681 Apnea, not elsewhere classified: Secondary | ICD-10-CM | POA: Diagnosis not present

## 2016-12-04 DIAGNOSIS — Z79899 Other long term (current) drug therapy: Secondary | ICD-10-CM | POA: Insufficient documentation

## 2016-12-04 DIAGNOSIS — R454 Irritability and anger: Secondary | ICD-10-CM

## 2016-12-04 DIAGNOSIS — Z794 Long term (current) use of insulin: Secondary | ICD-10-CM | POA: Diagnosis not present

## 2016-12-04 DIAGNOSIS — E669 Obesity, unspecified: Secondary | ICD-10-CM | POA: Insufficient documentation

## 2016-12-04 DIAGNOSIS — F329 Major depressive disorder, single episode, unspecified: Secondary | ICD-10-CM | POA: Insufficient documentation
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
## 2017-01-02 DIAGNOSIS — R0683 Snoring: Secondary | ICD-10-CM

## 2017-01-02 NOTE — Procedures (Signed)
    Patient Name: Taylor [name], Taylor [name] Study Date: 12/04/2016 Gender: Female D.O.B: 1970-12-23 Age (years): 46 Referring Provider: Lavenia AtlasMarilyn Foster Height (inches): 65 Interpreting Physician: Jetty Duhamellinton Mahlia Fernando MD, ABSM Weight (lbs): 197 RPSGT: Shelah LewandowskyGregory, Kenyon BMI: 33 MRN: [id number] Neck Size: 16.00 CLINICAL INFORMATION Sleep Study Type: NPSG  Indication for sleep study: Daytime Fatigue, Depression, Diabetes, Fatigue, Hypertension, Morning Headaches, Nocturnal Gasping, Non-refreshing Sleep, Obesity, Snoring, Witnesses Apnea / Gasping During Sleep  Epworth Sleepiness Score: 6  SLEEP STUDY TECHNIQUE As per the AASM Manual for the Scoring of Sleep and Associated Events v2.3 (April 2016) with a hypopnea requiring 4% desaturations.  The channels recorded and monitored were frontal, central and occipital EEG, electrooculogram (EOG), submentalis EMG (chin), nasal and oral airflow, thoracic and abdominal wall motion, anterior tibialis EMG, snore microphone, electrocardiogram, and pulse oximetry.  MEDICATIONS Medications self-administered by patient taken the night of the study : HYDROCODONE, LANTUS, XANAX, GABAPENTIN, ATENOLOL, IBUPROFEN  SLEEP ARCHITECTURE The study was initiated at 11:26:53 PM and ended at 5:52:50 AM.  Sleep onset time was 4.4 minutes and the sleep efficiency was 97.7%. The total sleep time was 377.1 minutes.  Stage REM latency was 60.5 minutes.  The patient spent 1.08% of the night in stage N1 sleep, 81.82% in stage N2 sleep, 0.00% in stage N3 and 17.11% in REM.  Alpha intrusion was absent.  Supine sleep was 100.00%.  RESPIRATORY PARAMETERS The overall apnea/hypopnea index (AHI) was 3.2 per hour. There were 7 total apneas, including 6 obstructive, 1 central and 0 mixed apneas. There were 13 hypopneas and 1 RERAs.  The AHI during Stage REM sleep was 8.4 per hour.  AHI while supine was 3.2 per hour.  The mean oxygen saturation was 94.81%. The minimum SpO2 during sleep was  89.00%.  moderate snoring was noted during this study.  CARDIAC DATA The 2 lead EKG demonstrated sinus rhythm. The mean heart rate was 79.98 beats per minute. Other EKG findings include: None.  LEG MOVEMENT DATA The total PLMS were 0 with a resulting PLMS index of 0.00. Associated arousal with leg movement index was 0.0 .  IMPRESSIONS - No significant obstructive sleep apnea occurred during this study (AHI = 3.2/h). - No significant central sleep apnea occurred during this study (CAI = 0.2/h). - The patient had minimal or no oxygen desaturation during the study (Min O2 = 89.00%, Mean 94.8%) - The patient snored with moderate snoring volume. - No cardiac abnormalities were noted during this study. - Clinically significant periodic limb movements did not occur during sleep. No significant associated arousals.  DIAGNOSIS - Primary Snoring (786.09 [R06.83 ICD-10])  RECOMMENDATIONS   - Sleep hygiene should be reviewed to assess factors that may improve sleep quality. - Weight management and regular exercise should be initiated or continued if appropriate.  [Electronically signed] 01/02/2017 04:07 PM  Jetty Duhamellinton Valkyrie Guardiola MD, ABSM Diplomate, American Board of Sleep Medicine   NPI: [id number]                         Jetty Duhamellinton Callia Swim Diplomate, American Board of Sleep Medicine  ELECTRONICALLY SIGNED ON:  01/02/2017, 4:05 PM [facility] PH: [phone]   FX: [phone] ACCREDITED BY THE AMERICAN ACADEMY OF SLEEP MEDICINE

## 2017-09-29 ENCOUNTER — Emergency Department (HOSPITAL_COMMUNITY): Payer: Medicare Other

## 2017-09-29 ENCOUNTER — Other Ambulatory Visit: Payer: Self-pay

## 2017-09-29 ENCOUNTER — Emergency Department (HOSPITAL_COMMUNITY)
Admission: EM | Admit: 2017-09-29 | Discharge: 2017-09-30 | Disposition: A | Discharge status: Home / Self Care | Payer: Medicare Other | Attending: Emergency Medicine | Admitting: Emergency Medicine

## 2017-09-29 ENCOUNTER — Encounter (HOSPITAL_COMMUNITY): Payer: Self-pay | Admitting: *Deleted

## 2017-09-29 DIAGNOSIS — J45909 Unspecified asthma, uncomplicated: Secondary | ICD-10-CM | POA: Diagnosis not present

## 2017-09-29 DIAGNOSIS — R079 Chest pain, unspecified: Secondary | ICD-10-CM | POA: Diagnosis not present

## 2017-09-29 DIAGNOSIS — R0789 Other chest pain: Secondary | ICD-10-CM

## 2017-09-29 DIAGNOSIS — J209 Acute bronchitis, unspecified: Secondary | ICD-10-CM

## 2017-09-29 DIAGNOSIS — Z79899 Other long term (current) drug therapy: Secondary | ICD-10-CM | POA: Diagnosis not present

## 2017-09-29 DIAGNOSIS — I10 Essential (primary) hypertension: Secondary | ICD-10-CM | POA: Diagnosis not present

## 2017-09-29 DIAGNOSIS — Z87891 Personal history of nicotine dependence: Secondary | ICD-10-CM | POA: Diagnosis not present

## 2017-09-29 DIAGNOSIS — E119 Type 2 diabetes mellitus without complications: Secondary | ICD-10-CM | POA: Insufficient documentation

## 2017-09-29 LAB — CBC
HCT: 43.1 % (ref 36.0–46.0)
Hemoglobin: 13.3 g/dL (ref 12.0–15.0)
MCH: 26.7 pg (ref 26.0–34.0)
MCHC: 30.9 g/dL (ref 30.0–36.0)
MCV: 86.5 fL (ref 78.0–100.0)
Platelets: 234 10*3/uL (ref 150–400)
RBC: 4.98 MIL/uL (ref 3.87–5.11)
RDW: 14.2 % (ref 11.5–15.5)
WBC: 8.7 10*3/uL (ref 4.0–10.5)

## 2017-09-29 LAB — BASIC METABOLIC PANEL
Anion gap: 10 (ref 5–15)
BUN: 7 mg/dL (ref 6–20)
CALCIUM: 9.7 mg/dL (ref 8.9–10.3)
CO2: 25 mmol/L (ref 22–32)
Chloride: 106 mmol/L (ref 98–111)
Creatinine, Ser: 0.94 mg/dL (ref 0.44–1.00)
Glucose, Bld: 105 mg/dL — ABNORMAL HIGH (ref 70–99)
Potassium: 3.7 mmol/L (ref 3.5–5.1)
SODIUM: 141 mmol/L (ref 135–145)

## 2017-09-29 LAB — I-STAT BETA HCG BLOOD, ED (MC, WL, AP ONLY)

## 2017-09-29 LAB — I-STAT TROPONIN, ED: TROPONIN I, POC: 0.01 ng/mL (ref 0.00–0.08)

## 2017-09-29 NOTE — ED Triage Notes (Signed)
Pt arrived by [facility] from pcp office. Pt having cold and cough symptoms x 1 week. Chest pain when coughing and breathing. No distress noted.

## 2017-09-30 DIAGNOSIS — R079 Chest pain, unspecified: Secondary | ICD-10-CM | POA: Diagnosis not present

## 2017-09-30 LAB — I-STAT TROPONIN, ED: TROPONIN I, POC: 0.01 ng/mL (ref 0.00–0.08)

## 2017-09-30 MED ORDER — AZITHROMYCIN 250 MG PO TABS: 250.0000 mg | ORAL_TABLET | Freq: Every day | ORAL | 0 refills | Status: AC

## 2017-09-30 MED ORDER — GUAIFENESIN-CODEINE 100-10 MG/5ML PO SOLN: 10.0000 mL | Freq: Four times a day (QID) | ORAL | 0 refills | Status: AC | PRN
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
## 2017-09-30 NOTE — Discharge Instructions (Addendum)
Zithromax as prescribed.  Robitussin with codeine as prescribed as needed for cough.  Follow-up with your primary doctor if not improving in the next week, and return to the ER if symptoms significantly worsen or change.

## 2017-09-30 NOTE — ED Notes (Signed)
Patient left at this time with all belongings. 

## 2019-05-31 ENCOUNTER — Emergency Department (HOSPITAL_COMMUNITY)
Admission: EM | Admit: 2019-05-31 | Discharge: 2019-05-31 | Disposition: A | Discharge status: Home / Self Care | Payer: Medicare HMO | Attending: Emergency Medicine | Admitting: Emergency Medicine

## 2019-05-31 ENCOUNTER — Encounter (HOSPITAL_COMMUNITY): Payer: Self-pay | Admitting: *Deleted

## 2019-05-31 ENCOUNTER — Ambulatory Visit (INDEPENDENT_AMBULATORY_CARE_PROVIDER_SITE_OTHER)
Admission: EM | Admit: 2019-05-31 | Discharge: 2019-05-31 | Disposition: A | Discharge status: Home / Self Care | Payer: Medicare HMO | Source: Home / Self Care | Attending: Family Medicine | Admitting: Family Medicine

## 2019-05-31 ENCOUNTER — Encounter (HOSPITAL_COMMUNITY): Payer: Self-pay

## 2019-05-31 ENCOUNTER — Other Ambulatory Visit: Payer: Self-pay

## 2019-05-31 DIAGNOSIS — Z5321 Procedure and treatment not carried out due to patient leaving prior to being seen by health care provider: Secondary | ICD-10-CM | POA: Diagnosis not present

## 2019-05-31 DIAGNOSIS — H5711 Ocular pain, right eye: Secondary | ICD-10-CM | POA: Insufficient documentation

## 2019-05-31 DIAGNOSIS — S0501XA Injury of conjunctiva and corneal abrasion without foreign body, right eye, initial encounter: Secondary | ICD-10-CM

## 2019-05-31 MED ORDER — ERYTHROMYCIN 5 MG/GM OP OINT: TOPICAL_OINTMENT | OPHTHALMIC | 0 refills | Status: DC

## 2019-05-31 NOTE — ED Triage Notes (Signed)
Pt having R eye pain that started after waking up. Pt denies any injury to eye but feels that something is scratching her eye. Attempted to flush with eyedrops with relief. Difficulty seeing out of R eye due to pain    
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
## 2019-05-31 NOTE — ED Triage Notes (Signed)
Pt c/o bilat eye pain; states right eye worse than left. Pt reports that after being outside in wind last night, she felt "something in my eyes" and still feels "irritated". Reports difficulty with vision. Pt states she irrigated eyes after initially feeling irritation.  Right eye edematous.

## 2019-05-31 NOTE — ED Triage Notes (Signed)
Pt having R eye pain that started after waking up. Pt denies any injury to eye but feels that something is scratching her eye. Attempted to flush with eyedrops with relief. Difficulty seeing out of R eye due to pain

## 2019-05-31 NOTE — ED Notes (Addendum)
Pt stated that she was not waiting any longer and that she is leaving. Pt has walked out of building.

## 2019-05-31 NOTE — Discharge Instructions (Signed)
Please contact Dr. Laruth Bouchard office to set up follow up appointment in the next 1-2 days Begin using erythromycin ointment 4-6 times daily for the next week Warm compresses to help with swelling to upper lid

## 2019-11-08 ENCOUNTER — Encounter (HOSPITAL_COMMUNITY): Payer: Self-pay | Admitting: *Deleted

## 2019-11-08 ENCOUNTER — Other Ambulatory Visit: Payer: Self-pay

## 2019-11-08 ENCOUNTER — Ambulatory Visit (HOSPITAL_COMMUNITY)
Admission: EM | Admit: 2019-11-08 | Discharge: 2019-11-08 | Disposition: A | Discharge status: Home / Self Care | Payer: Medicare HMO

## 2019-11-08 DIAGNOSIS — S0501XA Injury of conjunctiva and corneal abrasion without foreign body, right eye, initial encounter: Secondary | ICD-10-CM | POA: Diagnosis not present

## 2019-11-08 MED ORDER — ERYTHROMYCIN 5 MG/GM OP OINT: TOPICAL_OINTMENT | OPHTHALMIC | 0 refills | Status: AC

## 2019-11-08 MED ORDER — TETRACAINE HCL 0.5 % OP SOLN
OPHTHALMIC | Status: AC
  Filled 2019-11-08: qty 4

## 2019-11-08 MED ORDER — EYE WASH OPHTH SOLN
OPHTHALMIC | Status: AC
  Filled 2019-11-08: qty 118

## 2019-11-08 NOTE — Discharge Instructions (Signed)
Follow up with your Eye Doctor later this week for re-check
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
## 2019-11-08 NOTE — ED Triage Notes (Signed)
C/O sensation of foreign body in right eye since approx 0400 this AM.  States has flushed multiple times with eye wash without relief.  C/O tearing. Does not normally wear contacts or glasses.  Unable to complete visual acuity screening with right eye, as pt states she cannot keep right eye open due to discomfort.

## 2020-06-05 ENCOUNTER — Ambulatory Visit (INDEPENDENT_AMBULATORY_CARE_PROVIDER_SITE_OTHER): Payer: Medicare HMO | Admitting: Psychology

## 2020-06-05 DIAGNOSIS — F4310 Post-traumatic stress disorder, unspecified: Secondary | ICD-10-CM | POA: Diagnosis not present

## 2020-06-19 ENCOUNTER — Ambulatory Visit (INDEPENDENT_AMBULATORY_CARE_PROVIDER_SITE_OTHER): Payer: Medicare HMO | Admitting: Psychology

## 2020-06-19 DIAGNOSIS — F4310 Post-traumatic stress disorder, unspecified: Secondary | ICD-10-CM

## 2020-07-03 ENCOUNTER — Ambulatory Visit (INDEPENDENT_AMBULATORY_CARE_PROVIDER_SITE_OTHER): Payer: Medicare HMO | Admitting: Psychology

## 2020-07-03 DIAGNOSIS — F4310 Post-traumatic stress disorder, unspecified: Secondary | ICD-10-CM

## 2020-07-18 ENCOUNTER — Ambulatory Visit (INDEPENDENT_AMBULATORY_CARE_PROVIDER_SITE_OTHER): Payer: Medicare HMO | Admitting: Psychology

## 2020-07-18 DIAGNOSIS — F4310 Post-traumatic stress disorder, unspecified: Secondary | ICD-10-CM

## 2020-08-03 ENCOUNTER — Ambulatory Visit (INDEPENDENT_AMBULATORY_CARE_PROVIDER_SITE_OTHER): Payer: Medicare HMO | Admitting: Psychology

## 2020-08-03 DIAGNOSIS — F4310 Post-traumatic stress disorder, unspecified: Secondary | ICD-10-CM | POA: Diagnosis not present

## 2020-08-20 ENCOUNTER — Ambulatory Visit (INDEPENDENT_AMBULATORY_CARE_PROVIDER_SITE_OTHER): Payer: Medicare HMO | Admitting: Psychology

## 2020-08-20 DIAGNOSIS — F4310 Post-traumatic stress disorder, unspecified: Secondary | ICD-10-CM

## 2020-09-03 ENCOUNTER — Ambulatory Visit: Payer: Medicare HMO | Admitting: Psychology

## 2020-09-08 ENCOUNTER — Encounter (HOSPITAL_COMMUNITY): Payer: Self-pay | Admitting: Emergency Medicine

## 2020-09-08 ENCOUNTER — Other Ambulatory Visit: Payer: Self-pay

## 2020-09-08 ENCOUNTER — Ambulatory Visit (HOSPITAL_COMMUNITY)
Admission: EM | Admit: 2020-09-08 | Discharge: 2020-09-08 | Disposition: A | Discharge status: Home / Self Care | Payer: Medicare HMO

## 2020-09-08 DIAGNOSIS — S6991XA Unspecified injury of right wrist, hand and finger(s), initial encounter: Secondary | ICD-10-CM | POA: Diagnosis not present

## 2020-09-08 DIAGNOSIS — T300 Burn of unspecified body region, unspecified degree: Secondary | ICD-10-CM
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
## 2020-09-08 NOTE — Discharge Instructions (Addendum)
You can continue to use a cool compress or cool water for comfort.  DO not apply ice to your hand.    You can take Tylenol and/or Ibuprofen as needed for pain relief.    Use aloe vera to help with pain.  You can also apply an antibiotic ointment to help prevent infection.   If you develop any blisters, do not pop them.    Return or go to the Emergency Department if symptoms worsen or do not improve in the next few days.
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
## 2020-09-17 ENCOUNTER — Ambulatory Visit (INDEPENDENT_AMBULATORY_CARE_PROVIDER_SITE_OTHER): Payer: Medicare HMO | Admitting: Psychology

## 2020-09-17 DIAGNOSIS — F4310 Post-traumatic stress disorder, unspecified: Secondary | ICD-10-CM
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
## 2020-09-25 ENCOUNTER — Ambulatory Visit: Payer: Self-pay | Admitting: Cardiology

## 2020-10-25 ENCOUNTER — Ambulatory Visit (INDEPENDENT_AMBULATORY_CARE_PROVIDER_SITE_OTHER): Payer: Medicare HMO | Admitting: Psychology

## 2020-10-25 DIAGNOSIS — F4310 Post-traumatic stress disorder, unspecified: Secondary | ICD-10-CM | POA: Diagnosis not present

## 2020-11-26 ENCOUNTER — Ambulatory Visit (INDEPENDENT_AMBULATORY_CARE_PROVIDER_SITE_OTHER): Payer: Medicare HMO | Admitting: Psychology

## 2020-11-26 DIAGNOSIS — F4310 Post-traumatic stress disorder, unspecified: Secondary | ICD-10-CM

## 2021-01-02 ENCOUNTER — Ambulatory Visit (INDEPENDENT_AMBULATORY_CARE_PROVIDER_SITE_OTHER): Payer: Medicare HMO | Admitting: Psychology

## 2021-01-02 DIAGNOSIS — F4310 Post-traumatic stress disorder, unspecified: Secondary | ICD-10-CM

## 2021-01-02 NOTE — Progress Notes (Signed)
[place] Counselor/Therapist Progress Note  Patient ID: Taylor [name], MRN: [id number]    Date: 01/02/21  Time Spent: 12:38  pm - 1:32 pm : 54 Minutes  Treatment Type: Individual Therapy.  Reported Symptoms: Low frustration tolerance, irritability,   Mental Status Exam: Appearance:  Well Groomed     Behavior: Appropriate  Motor: Normal  Speech/Language:  Clear and Coherent  Affect: Congruent  Mood: sad  Thought process: normal  Thought content:   WNL  Sensory/Perceptual disturbances:   WNL  Orientation: oriented to person, place, time/date, and situation  Attention: Good  Concentration: Good  Memory: WNL  Fund of knowledge:  Good  Insight:   Good  Judgment:  Good  Impulse Control: Good   Risk Assessment: Danger to Self:  No Self-injurious Behavior: No Danger to Others: No Duty to Warn:no Physical Aggression / Violence:No  Access to Firearms a concern: No  Gang Involvement:No   Subjective:   Taylor [name] participated from home, via video, and consented to treatment. Therapist participated from home office. We met online due to [place] pandemic. Christine reviewed the events of the past week. Taylor [name] noted a recent phone follow-up with her provider, Stephannie Peters, NP, who adjusted her medications but noted being in need of an in-person follow-up, which therapist encouraged.  Taylor [name] will be some frustration with her daughter's behavior, including refusal to take her medication consistently, or follow clear instructions and received discipline well.  We processed her recent attempts to manage his concerns during session.  Taylor [name] noted levels of frustration having to repeatedly provide directives to her daughter including asking permission to use other people's belongings, following simple directions, and being mindful of other children's feelings at school.  We reviewed ways to communicate boundaries in a developmentally appropriate manner, develop a list of  developmentally appropriate forms of discipline, and discussed the importance of creating a routine.  Taylor [name] discussed that her daughter has ADHD which is a contributing factor, she believes, to the issues at hand.  Therapist modeled boundary setting during the session and encouraged Taylor [name] to set reasonable goals and expectations for her daughter's behavior as she begins to enact new rules and boundaries.  Therapist encouraged her also to take breaks, engage in self-care, and be mindful of progress positively reinforcing it.  Therapist validated Taylor [name]'s experience and feelings and provided supportive therapy.  Interventions: Assertiveness/Communication and boundary setting.  Diagnosis: PTSD (post-traumatic stress disorder)   Treatment Plan:  Client Abilities/Strengths Taylor [name] is forthcoming and motivated for change.   Client Treatment Preferences Outpatient Therapy.   Client Statement of Needs Taylor [name] discussed her goals for treatment including improving her symptoms including her poor sleep, address interpersonal stressors, process past events, and engaging in consistent self-care.   Treatment Level Weekly  Symptoms Panic symptoms (daily), hypervigilance, consistent worry, difficulty managing worry, physiological tension, difficulty sitting still, difficulty relaxing, irritability, possible OCD symptoms (obs, comp, & relief), loss of interest, feeling down, disturbed sleep (insomnia, middle insomnia), poor appetite, feeling bad about self, difficulty concentrating, psychomotor retardation, denied SI but noted wishing the "day would slow down", "Paranoia" (Hypervigilance).    (Status: maintained)  Goals:   Taylor [name] experiences symptoms of PTSD including symptoms of depression and anxiety.    Target Date: 06/19/21 Frequency: Weekly  Progress: 0 Modality: individual    Therapist will provide referrals for additional resources as appropriate.  Therapist will provide psycho-education regarding  Taylor [name]'s diagnosis and corresponding treatment approaches and interventions. Taylor [name] will employ CBT, BA, Problem-solving, Solution Focused,  Mindfulness, and coping skills to help manage decrease symptoms associated with her diagnosis.   Reduce overall level, frequency, and intensity of the feelings of depression, anxiety, PTSD, and panic evidenced by decrease in her overall symptoms from 6 to 7 days/week to 0 to 1 days/week per client report for at least 3 consecutive months. Verbally express understanding of the relationship between feelings of depression, anxiety and their impact on thinking patterns and behaviors. Verbalize an understanding of the role that distorted thinking plays in creating fears, excessive worry, and ruminations.  Taylor [name] participated in the creation of the treatment plan)   [name], LCSW

## 2021-02-01 ENCOUNTER — Ambulatory Visit (INDEPENDENT_AMBULATORY_CARE_PROVIDER_SITE_OTHER): Payer: Medicare HMO | Admitting: Psychology

## 2021-02-01 DIAGNOSIS — F4310 Post-traumatic stress disorder, unspecified: Secondary | ICD-10-CM | POA: Diagnosis not present

## 2021-02-01 NOTE — Progress Notes (Signed)
[place] Counselor/Therapist Progress Note  Patient ID: Taylor [name], MRN: [id number]    Date: 02/01/21  Time Spent: 11:13  am - 11:56 am : 43 Minutes  Treatment Type: Individual Therapy.  Reported Symptoms: Low frustration tolerance, irritability,   Mental Status Exam: Appearance:  Well Groomed     Behavior: Appropriate  Motor: Normal  Speech/Language:  Clear and Coherent  Affect: Congruent  Mood: sad  Thought process: normal  Thought content:   WNL  Sensory/Perceptual disturbances:   WNL  Orientation: oriented to person, place, time/date, and situation  Attention: Good  Concentration: Good  Memory: WNL  Fund of knowledge:  Good  Insight:   Good  Judgment:  Good  Impulse Control: Good   Risk Assessment: Danger to Self:  No Self-injurious Behavior: No Danger to Others: No Duty to Warn:no Physical Aggression / Violence:No  Access to Firearms a concern: No  Gang Involvement:No   Subjective:   Taylor [name] participated from home, via video, and consented to treatment. Therapist participated from home office. We met online due to [place] pandemic. Taylor [name] reviewed the events of the past week. She noted current stressors including her daughter's mental health and lack of following directions consistently, her sister's recent hospitalization, and her own overall mood.  Taylor [name] noted difficulty managing her own symptoms and discussed her daughter's behavior as a major contributing factor to this.  She continues to see Taylor Peters, NP, for treatment and will schedule to follow up with her soon.  We worked on identifying Taylor [name]'s previous attempts to address her daughter's behavior, which she highlighted as ineffectual.  We engaged in problem solving during the session.  Therapist encouraged Taylor [name] to consider a psychiatric consult for her daughter, with the child and adolescent psychiatrist, for further assessment.  Resources were provided via email, for Taylor [name] to  follow up on.  We worked on reviewing coping skills during the session and discussed the importance of self-care and boundaries for self and others.  Posture was engaged and motivated during the session and expressed commitment towards her goals.  Therapist validated and normalized conscious feelings and experience and provided supportive therapy. A follow-up was scheduled for next month.  She would benefit from continued treatment.  Interventions: Assertiveness/Communication and boundary setting.  Diagnosis: PTSD (post-traumatic stress disorder)   Treatment Plan:  Client Abilities/Strengths Taylor [name] is forthcoming and motivated for change.   Client Treatment Preferences Outpatient Therapy.   Client Statement of Needs Taylor [name] discussed her goals for treatment including improving her symptoms including her poor sleep, address interpersonal stressors, process past events, and engaging in consistent self-care.   Treatment Level Weekly  Symptoms Panic symptoms (daily), hypervigilance, consistent worry, difficulty managing worry, physiological tension, difficulty sitting still, difficulty relaxing, irritability, possible OCD symptoms (obs, comp, & relief), loss of interest, feeling down, disturbed sleep (insomnia, middle insomnia), poor appetite, feeling bad about self, difficulty concentrating, psychomotor retardation, denied SI but noted wishing the "day would slow down", "Paranoia" (Hypervigilance).    (Status: maintained)  Goals:   Taylor [name] experiences symptoms of PTSD including symptoms of depression and anxiety.    Target Date: 06/19/21 Frequency: Weekly  Progress: 0 Modality: individual    Therapist will provide referrals for additional resources as appropriate.  Therapist will provide psycho-education regarding Taylor [name]'s diagnosis and corresponding treatment approaches and interventions. Taylor [name] will employ CBT, BA, Problem-solving, Solution Focused, Mindfulness, and coping skills to help  manage decrease symptoms associated with her diagnosis.   Reduce overall level, frequency, and intensity  of the feelings of depression, anxiety, PTSD, and panic evidenced by decrease in her overall symptoms from 6 to 7 days/week to 0 to 1 days/week per client report for at least 3 consecutive months. Verbally express understanding of the relationship between feelings of depression, anxiety and their impact on thinking patterns and behaviors. Verbalize an understanding of the role that distorted thinking plays in creating fears, excessive worry, and ruminations.  Taylor [name] participated in the creation of the treatment plan)   Taylor Irish, LCSW

## 2021-03-01 ENCOUNTER — Ambulatory Visit (INDEPENDENT_AMBULATORY_CARE_PROVIDER_SITE_OTHER): Payer: Medicare HMO | Admitting: Psychology

## 2021-03-01 DIAGNOSIS — F4310 Post-traumatic stress disorder, unspecified: Secondary | ICD-10-CM

## 2021-03-01 NOTE — Progress Notes (Signed)
[place] Counselor/Therapist Progress Note  Patient ID: ARVIE VILLARRUEL, MRN: [id number]    Date: 03/01/21  Time Spent: 11:07 am - 12:04 pm : 61 Minutes  Treatment Type: Individual Therapy.  Reported Symptoms: Low frustration tolerance, irritability,   Mental Status Exam: Appearance:  Well Groomed     Behavior: Appropriate  Motor: Normal  Speech/Language:  Clear and Coherent  Affect: Congruent  Mood: sad  Thought process: normal  Thought content:   WNL  Sensory/Perceptual disturbances:   WNL  Orientation: oriented to person, place, time/date, and situation  Attention: Good  Concentration: Good  Memory: WNL  Fund of knowledge:  Good  Insight:   Good  Judgment:  Good  Impulse Control: Good   Risk Assessment: Danger to Self:  No Self-injurious Behavior: No Danger to Others: No Duty to Warn:no Physical Aggression / Violence:No  Access to Firearms a concern: No  Gang Involvement:No   Subjective:   [name] participated from home, via phone, and consented to treatment. Therapist participated from home office. We met online due to [place] pandemic. Briggette reviewed the events of the past week. Sharifa noted her daughter being bullied by schoolmates which affected her behavior in the home. We explored this during the session and discussed the effect of this on her overall mood.We explored her daughter's school stressors and the effect of these stressors/bullying on her daughter's mood and outlook. Anjolaoluwa noted feelings of frustration, irritation, and confusion. We explored her attempts to manage her feelings and ways to manage this going forward. We explored additional coping as she is slated to communicate with her daughter's school regarding these feelings. Therapist validated Sherrina's feelings and experiences. A follow-up was scheduled for next month.  She would benefit from continued treatment.  Interventions: Assertiveness/Communication and boundary  setting.  Diagnosis: PTSD (post-traumatic stress disorder)   Treatment Plan:  Client Abilities/Strengths Karli is forthcoming and motivated for change.   Client Treatment Preferences Outpatient Therapy.   Client Statement of Needs Deneka discussed her goals for treatment including improving her symptoms including her poor sleep, address interpersonal stressors, process past events, and engaging in consistent self-care.   Treatment Level Weekly  Symptoms Panic symptoms (daily), hypervigilance, consistent worry, difficulty managing worry, physiological tension, difficulty sitting still, difficulty relaxing, irritability, possible OCD symptoms (obs, comp, & relief), loss of interest, feeling down, disturbed sleep (insomnia, middle insomnia), poor appetite, feeling bad about self, difficulty concentrating, psychomotor retardation, denied SI but noted wishing the "day would slow down", "Paranoia" (Hypervigilance).    (Status: maintained)  Goals:   Eveline experiences symptoms of PTSD including symptoms of depression and anxiety.    Target Date: 06/19/21 Frequency: Weekly  Progress: 0 Modality: individual    Therapist will provide referrals for additional resources as appropriate.  Therapist will provide psycho-education regarding Marajade's diagnosis and corresponding treatment approaches and interventions. Marabella will employ CBT, BA, Problem-solving, Solution Focused, Mindfulness, and coping skills to help manage decrease symptoms associated with her diagnosis.   Reduce overall level, frequency, and intensity of the feelings of depression, anxiety, PTSD, and panic evidenced by decrease in her overall symptoms from 6 to 7 days/week to 0 to 1 days/week per client report for at least 3 consecutive months. Verbally express understanding of the relationship between feelings of depression, anxiety and their impact on thinking patterns and behaviors. Verbalize an understanding of the role that  distorted thinking plays in creating fears, excessive worry, and ruminations.  [name] participated in the creation of the treatment  plan)  [name], LCSW

## 2021-03-29 ENCOUNTER — Ambulatory Visit (INDEPENDENT_AMBULATORY_CARE_PROVIDER_SITE_OTHER): Payer: Medicare HMO | Admitting: Psychology

## 2021-03-29 DIAGNOSIS — F4310 Post-traumatic stress disorder, unspecified: Secondary | ICD-10-CM
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
## 2021-04-25 ENCOUNTER — Encounter: Payer: Medicare HMO | Admitting: Psychology

## 2021-04-25 NOTE — Progress Notes (Signed)
This encounter was created in error - please disregard.

## 2021-07-05 ENCOUNTER — Ambulatory Visit (INDEPENDENT_AMBULATORY_CARE_PROVIDER_SITE_OTHER): Payer: Medicare HMO | Admitting: Psychology

## 2021-07-05 DIAGNOSIS — F4310 Post-traumatic stress disorder, unspecified: Secondary | ICD-10-CM

## 2021-07-05 NOTE — Progress Notes (Unsigned)
[facility] Counselor/Therapist Progress Note  Patient ID: Taylor [name], MRN: [id number]    Date: 07/05/21  Time Spent: 1:05 pm - 1:59 pm : 54 Minutes  Treatment Type: Individual Therapy.  Reported Symptoms: Low frustration tolerance, irritability,   Mental Status Exam: Appearance:  Well Groomed     Behavior: Appropriate  Motor: Normal  Speech/Language:  Clear and Coherent  Affect: Congruent  Mood: sad  Thought process: normal  Thought content:   WNL  Sensory/Perceptual disturbances:   WNL  Orientation: oriented to person, place, time/date, and situation  Attention: Good  Concentration: Good  Memory: WNL  Fund of knowledge:  Good  Insight:   Good  Judgment:  Good  Impulse Control: Good   Risk Assessment: Danger to Self:  No Self-injurious Behavior: No Danger to Others: No Duty to Warn:no Physical Aggression / Violence:No  Access to Firearms a concern: No  Gang Involvement:No   Subjective:   Taylor [name] participated from home, via phone, and consented to treatment. Therapist participated from home office. We met online due to [place] pandemic. Taylor [name] reviewed the events of the past week.   Taylor [name] noted attempting to get guardianship paperwork for her eldest daughter but having significant difficulty. She noted frustration regarding this development. She noted having to explain her concerns to the judge and her daughter being present for this conversation. She noted her daughter having numerous health issues including cerebral palsy and a stroke in utero. Additional health issues include ventriculoperitoneal shunt (VP shunt). She noted her daughter having a lower IQ and was ruled "incompetent", which Taylor [name] disagrees with the label. She noted her daughter often being gullible and making decisions without much forethought. We explored Taylor [name]'s feelings and frustrations during the session. Therapist validated and normalized Taylor [name]'s feelings during the session. Taylor [name]  continues to benefit from counseling and will complete an annual reassessment during our follow-up. Therapist provided supportive therapy.    Interventions: Assertiveness/Communication and boundary setting.  Diagnosis: PTSD (post-traumatic stress disorder)   Treatment Plan:  Client Abilities/Strengths Taylor [name] is forthcoming and motivated for change.   Client Treatment Preferences Outpatient Therapy.   Client Statement of Needs Taylor [name] discussed her goals for treatment including improving her symptoms including her poor sleep, address interpersonal stressors, process past events, and engaging in consistent self-care.   Treatment Level Weekly  Symptoms Panic symptoms (daily), hypervigilance, consistent worry, difficulty managing worry, physiological tension, difficulty sitting still, difficulty relaxing, irritability, possible OCD symptoms (obs, comp, & relief), loss of interest, feeling down, disturbed sleep (insomnia, middle insomnia), poor appetite, feeling bad about self, difficulty concentrating, psychomotor retardation, denied SI but noted wishing the "day would slow down", "Paranoia" (Hypervigilance).    (Status: declined)  Goals:   Taylor [name] experiences symptoms of PTSD including symptoms of depression and anxiety.    Target Date: 6/31/23 Frequency: Weekly  Progress: 0 Modality: individual    Therapist will provide referrals for additional resources as appropriate.  Therapist will provide psycho-education regarding Taylor [name]'s diagnosis and corresponding treatment approaches and interventions. Taylor [name] will employ CBT, BA, Problem-solving, Solution Focused, Mindfulness, and coping skills to help manage decrease symptoms associated with her diagnosis.   Reduce overall level, frequency, and intensity of the feelings of depression, anxiety, PTSD, and panic evidenced by decrease in her overall symptoms from 6 to 7 days/week to 0 to 1 days/week per client report for at least 3 consecutive  months. Verbally express understanding of the relationship between feelings of depression, anxiety and their impact on thinking patterns and  behaviors. Verbalize an understanding of the role that distorted thinking plays in creating fears, excessive worry, and ruminations.  Taylor [name] participated in the creation of the treatment plan)  Taylor Irish, LCSW

## 2021-07-12 ENCOUNTER — Ambulatory Visit (INDEPENDENT_AMBULATORY_CARE_PROVIDER_SITE_OTHER): Payer: Medicare HMO | Admitting: Psychology

## 2021-07-12 DIAGNOSIS — F4310 Post-traumatic stress disorder, unspecified: Secondary | ICD-10-CM | POA: Diagnosis not present

## 2021-08-05 ENCOUNTER — Ambulatory Visit (INDEPENDENT_AMBULATORY_CARE_PROVIDER_SITE_OTHER): Payer: Medicare HMO | Admitting: Psychology

## 2021-08-05 DIAGNOSIS — F4310 Post-traumatic stress disorder, unspecified: Secondary | ICD-10-CM

## 2021-08-05 NOTE — Progress Notes (Signed)
[facility] Counselor/Therapist Progress Note  Patient ID: ELNOR RENOVATO, MRN: [id number]    Date: 08/05/21  Time Spent: 2:01 pm - 2:59 pm : 72 Minutes  Treatment Type: Individual Therapy.  Reported Symptoms: Low frustration tolerance, irritability,   Mental Status Exam: Appearance:  Well Groomed     Behavior: Appropriate  Motor: Normal  Speech/Language:  Clear and Coherent  Affect: Congruent  Mood: sad  Thought process: normal  Thought content:   WNL  Sensory/Perceptual disturbances:   WNL  Orientation: oriented to person, place, time/date, and situation  Attention: Good  Concentration: Good  Memory: WNL  Fund of knowledge:  Good  Insight:   Good  Judgment:  Good  Impulse Control: Good   Risk Assessment: Danger to Self:  No Self-injurious Behavior: No Danger to Others: No Duty to Warn:no Physical Aggression / Violence:No  Access to Firearms a concern: No  Gang Involvement:No   Subjective:   [name] participated from home, via phone, and consented to treatment. Therapist participated from office. We met online due to [place] pandemic. Nafeesah reviewed the events of the past week. Trude noted difficulty with her overall health and her attempts to address this with her PCP. She noted a reduced appetite and corresponding weight-loss. She noted the difficulty managing her health, addressing household chores, caring for her children, and managing her own mood. She discussed her continual follow-up with medical providers to address hear health. We worked on processing this during the session. Therapist validated Estel's feelings and experience, provided supportive therapy, and encouraged self-care. She would like to process how she perceives others perceiving her during out follow-up.   Interventions: Cognitive Behavioral Therapy  Diagnosis: PTSD (post-traumatic stress disorder)   Treatment Plan:  Client Abilities/Strengths Avilyn is forthcoming and  motivated for change.   Client Treatment Preferences Outpatient Therapy.   Client Statement of Needs Kayliana discussed her goals for treatment including improving her symptoms including her poor sleep, address interpersonal stressors, process past events, and engaging in consistent self-care.   Treatment Level Weekly  Symptoms Panic symptoms (daily), hypervigilance, consistent worry, difficulty managing worry, physiological tension, difficulty sitting still, difficulty relaxing, irritability, possible OCD symptoms (obs, comp, & relief), loss of interest, feeling down, disturbed sleep (insomnia, middle insomnia), poor appetite, feeling bad about self, difficulty concentrating, psychomotor retardation, denied SI but noted wishing the "day would slow down", "Paranoia" (Hypervigilance).    (Status: maintained)  Goals:   Chelby experiences symptoms of PTSD including symptoms of depression and anxiety.    Target Date: 08/19/21 Frequency: Weekly  Progress: 0 Modality: individual    Therapist will provide referrals for additional resources as appropriate.  Therapist will provide psycho-education regarding Gianna's diagnosis and corresponding treatment approaches and interventions. Tiffannie will employ CBT, BA, Problem-solving, Solution Focused, Mindfulness, and coping skills to help manage decrease symptoms associated with her diagnosis.   Reduce overall level, frequency, and intensity of the feelings of depression, anxiety, PTSD, and panic evidenced by decrease in her overall symptoms from 6 to 7 days/week to 0 to 1 days/week per client report for at least 3 consecutive months. Verbally express understanding of the relationship between feelings of depression, anxiety and their impact on thinking patterns and behaviors. Verbalize an understanding of the role that distorted thinking plays in creating fears, excessive worry, and ruminations.  [name] participated in the creation of the treatment  plan)  [name], LCSW

## 2021-09-09 ENCOUNTER — Ambulatory Visit (INDEPENDENT_AMBULATORY_CARE_PROVIDER_SITE_OTHER): Payer: Medicare HMO | Admitting: Psychology

## 2021-09-09 DIAGNOSIS — F4310 Post-traumatic stress disorder, unspecified: Secondary | ICD-10-CM | POA: Diagnosis not present
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
## 2021-09-26 ENCOUNTER — Ambulatory Visit (INDEPENDENT_AMBULATORY_CARE_PROVIDER_SITE_OTHER): Payer: Medicare HMO | Admitting: Psychology

## 2021-09-26 DIAGNOSIS — F4310 Post-traumatic stress disorder, unspecified: Secondary | ICD-10-CM | POA: Diagnosis not present

## 2021-09-26 NOTE — Progress Notes (Signed)
[facility] Counselor/Therapist Progress Note  Patient ID: Taylor [name], MRN: [id number]   Date: 09/26/21  Time Spent: 10:04  am - 11:02 am : 8 Minutes  Treatment Type: Individual Therapy.   Mental Status Exam: Appearance:  NA     Behavior: Appropriate  Motor: na  Speech/Language:  Clear and Coherent and Normal Rate  Affect: Flat  Mood: anxious and dysthymic  Thought process: normal  Thought content:   WNL  Sensory/Perceptual disturbances:   WNL  Orientation: oriented to person, place, time/date, and situation  Attention: Good  Concentration: Good  Memory: WNL  Fund of knowledge:  Good  Insight:   Good  Judgment:  Good  Impulse Control: Good   Risk Assessment: Danger to Self:  No Self-injurious Behavior: No Danger to Others: No Duty to Warn:no Physical Aggression / Violence:No  Access to Firearms a concern: No  Gang Involvement:No   Subjective:   Taylor [name] participated from home, via video and consented to treatment. Therapist participated from home office. We met online due to [name] pandemic. Taylor [name] reviewed the events of the past week. We reviewed numerous treatment approaches including CBT, BA, Problem Solving, and Solution focused therapy. Psych-education regarding the [name] diagnosis of PTSD (post-traumatic stress disorder) was provided during the session. We discussed Taylor [name]'s goals treatment goals which include manage mood, process events, set boundaries for self and others, communicate positively and assertively, manage overall symptoms, increase frustration tolerance, improve self-talk, engage in self-care.  We discussed focus on managing parenting stressors and setting boundaries for her youngest daughter who deals with unmanaged ADHD. Further focus in this area is warranted due to effect on mood and outlook. Taylor [name] provided verbal approval of the treatment plan.   Interventions: Psycho-education & Goal Setting.   Diagnosis:   PTSD (post-traumatic stress disorder)   Treatment Plan:  Client Abilities/Strengths Taylor [name] is forthcoming and motivated for change.   Support System: Family and friends.   Client Treatment Preferences Outpatient Therapy.   Client Statement of Needs Taylor [name] would like to manage mood, process events, set boundaries for self and others, communicate positively and assertively, manage overall symptoms, increase frustration tolerance, improve self-talk, engage in self-care.    Treatment Level Weekly  Symptoms Depression: Sadness, lethargy, poor sleep, low motivation, irritability, negative self-talk, cognitive distortions (personalization).    (Status: maintained) Anxiety: Frequent worry, tension, difficulty managing anxiety, worrying about different things, feeling on edge.   (Status: maintained)  Goals:   Taylor [name] experiences symptoms of depression, anxiety, and PTSD.    Target Date: 09/27/22 Frequency: Weekly  Progress: 0 Modality: individual    Therapist will provide referrals for additional resources as appropriate.  Therapist will provide psycho-education regarding Taylor [name]'s diagnosis and corresponding treatment approaches and interventions. Licensed Clinical Social Worker, [place], LCSW will support the patient's ability to achieve the goals identified. will employ CBT, BA, Problem-solving, Solution Focused, Mindfulness,  coping skills, & other evidenced-based practices will be used to promote progress towards healthy functioning to help manage decrease symptoms associated with her diagnosis.   Reduce overall level, frequency, and intensity of the feelings of depression, anxiety and panic evidenced by decreased overall symptoms from 6 to 7 days/week to 0 to 1 days/week per client report for at least 3 consecutive months. Verbally express understanding of the relationship between feelings of depression, anxiety and their impact on thinking patterns and behaviors. Verbalize an  understanding of the role that distorted thinking plays in creating fears, excessive worry, and  ruminations.    [name] participated in the creation of the treatment plan)

## 2021-10-10 ENCOUNTER — Ambulatory Visit (INDEPENDENT_AMBULATORY_CARE_PROVIDER_SITE_OTHER): Payer: Medicare HMO | Admitting: Psychology

## 2021-10-10 DIAGNOSIS — F4310 Post-traumatic stress disorder, unspecified: Secondary | ICD-10-CM | POA: Diagnosis not present

## 2021-10-10 NOTE — Progress Notes (Signed)
[facility] Counselor/Therapist Progress Note  Patient ID: Taylor [name], MRN: [id number]   Date: 09/26/21  Time Spent: 9:03  am - 9:56 am : 53 Minutes  Treatment Type: Individual Therapy.   Mental Status Exam: Appearance:  NA     Behavior: Appropriate  Motor: na  Speech/Language:  Clear and Coherent and Normal Rate  Affect: Flat  Mood: anxious and dysthymic  Thought process: normal  Thought content:   WNL  Sensory/Perceptual disturbances:   WNL  Orientation: oriented to person, place, time/date, and situation  Attention: Good  Concentration: Good  Memory: WNL  Fund of knowledge:  Good  Insight:   Good  Judgment:  Good  Impulse Control: Good   Risk Assessment: Danger to Self:  No Self-injurious Behavior: No Danger to Others: No Duty to Warn:no Physical Aggression / Violence:No  Access to Firearms a concern: No  Gang Involvement:No   Subjective:   Taylor [name] participated from car, via video and consented to treatment. Therapist participated from home office. We met online due to Taylor [name]. Taylor [name] reviewed the events of the past week. She noted continued pain and discussed her interest in visiting a pain clinic. She endorsed anxiety and lethargy. She noted the numerous stressors related to her health and the difficulty functioning as a result. She is following up with her medical providers. She noted continued frustration due to her daughter's difficulty with following directions in a timely manner. We worked on identifying ways to address concerns, create more structure, develop routines that are conducive of the goal, and manage her stressors. She noted the pressures related to her role as a parent. We worked on identifying additional boundaries and repercussions for not following directions. We engaged in problem-solving to address concerns. Taylor [name] was engaged and motivated during the session. She expressed commitment towards our goals. Therapist praised  Taylor [name] and provided supportive therapy.   Interventions: Interpersonal, problem-solving.   Diagnosis:  PTSD (post-traumatic stress disorder)  Treatment Plan:  Client Abilities/Strengths Taylor [name] is forthcoming and motivated for change.   Support System: Family and friends.   Client Treatment Preferences Outpatient Therapy.   Client Statement of Needs Taylor [name] would like to manage mood, process events, set boundaries for self and others, communicate positively and assertively, manage overall symptoms, increase frustration tolerance, improve self-talk, engage in self-care.    Treatment Level Weekly  Symptoms Depression: Sadness, lethargy, poor sleep, low motivation, irritability, negative self-talk, cognitive distortions (personalization).    (Status: maintained) Anxiety: Frequent worry, tension, difficulty managing anxiety, worrying about different things, feeling on edge.   (Status: maintained)  Goals:   Taylor [name] experiences symptoms of depression, anxiety, and PTSD.    Target Date: 09/27/22 Frequency: Weekly  Progress: 0 Modality: individual    Therapist will provide referrals for additional resources as appropriate.  Therapist will provide psycho-education regarding Taylor [name]'s diagnosis and corresponding treatment approaches and interventions. Licensed Clinical Social Worker, [place], LCSW will support the patient's ability to achieve the goals identified. will employ CBT, BA, Problem-solving, Solution Focused, Mindfulness,  coping skills, & other evidenced-based practices will be used to promote progress towards healthy functioning to help manage decrease symptoms associated with her diagnosis.   Reduce overall level, frequency, and intensity of the feelings of depression, anxiety and panic evidenced by decreased overall symptoms from 6 to 7 days/week to 0 to 1 days/week per client report for at least 3 consecutive months. Verbally express understanding of the relationship between  feelings of depression, anxiety and their impact  on thinking patterns and behaviors. Verbalize an understanding of the role that distorted thinking plays in creating fears, excessive worry, and ruminations.    Taylor [name] participated in the creation of the treatment plan)

## 2021-10-24 ENCOUNTER — Ambulatory Visit (INDEPENDENT_AMBULATORY_CARE_PROVIDER_SITE_OTHER): Payer: Medicare HMO | Admitting: Psychology

## 2021-10-24 DIAGNOSIS — F4310 Post-traumatic stress disorder, unspecified: Secondary | ICD-10-CM | POA: Diagnosis not present

## 2021-10-24 NOTE — Progress Notes (Signed)
[facility] Counselor/Therapist Progress Note  Patient ID: Taylor [name], MRN: [id number]   Date: 09/26/21  Time Spent: 3:03  pm - 3:58 Pm : 55 Minutes  Treatment Type: Individual Therapy.   Mental Status Exam: Appearance:  NA     Behavior: Appropriate  Motor: na  Speech/Language:  Clear and Coherent and Normal Rate  Affect: Flat  Mood: anxious and dysthymic  Thought process: normal  Thought content:   WNL  Sensory/Perceptual disturbances:   WNL  Orientation: oriented to person, place, time/date, and situation  Attention: Good  Concentration: Good  Memory: WNL  Fund of knowledge:  Good  Insight:   Good  Judgment:  Good  Impulse Control: Good   Risk Assessment: Danger to Self:  No Self-injurious Behavior: No Danger to Others: No Duty to Warn:no Physical Aggression / Violence:No  Access to Firearms a concern: No  Gang Involvement:No   Subjective:   Taylor [name] participated from car, via video and consented to treatment. Therapist participated from home office. We met online due to [place] pandemic. Taylor [name] reviewed the events of the past week. She noted being diagnosed with bronchitis and is being treated by her PCP. She noted complications with her vision in relation to new medication. She noted varying medical issues that are affecting her day-to-day quality of life. She noted the negative effect of her medication on her appetite. We discussed her current coping skills and ways to de-stress. She noted continued stressors related to her daughter being bullied by schoolmates and feeling unsupported. She noted feeling helpless in this regard. We worked on problem-solving and ways to advocate for self and daughter.  Therapist validated and normalized Taylor [name]'s feelings and experience.  We reviewed self-care and ways to set boundaries for self and others. Taylor [name] was engaged and motivated during the session and expressed commitment towards her goals.  Therapist provided  supportive therapy   Interventions: Interpersonal, problem-solving.   Diagnosis:  PTSD (post-traumatic stress disorder)  Treatment Plan:  Client Abilities/Strengths Taylor [name] is forthcoming and motivated for change.   Support System: Family and friends.   Client Treatment Preferences Outpatient Therapy.   Client Statement of Needs Taylor [name] would like to manage mood, process events, set boundaries for self and others, communicate positively and assertively, manage overall symptoms, increase frustration tolerance, improve self-talk, engage in self-care.    Treatment Level Weekly  Symptoms Depression: Sadness, lethargy, poor sleep, low motivation, irritability, negative self-talk, cognitive distortions (personalization).    (Status: maintained) Anxiety: Frequent worry, tension, difficulty managing anxiety, worrying about different things, feeling on edge.   (Status: maintained)  Goals:   Taylor [name] experiences symptoms of depression, anxiety, and PTSD.    Target Date: 09/27/22 Frequency: Weekly  Progress: 0 Modality: individual    Therapist will provide referrals for additional resources as appropriate.  Therapist will provide psycho-education regarding Taylor [name]'s diagnosis and corresponding treatment approaches and interventions. Licensed Clinical Social Worker, [place], LCSW will support the patient's ability to achieve the goals identified. will employ CBT, BA, Problem-solving, Solution Focused, Mindfulness,  coping skills, & other evidenced-based practices will be used to promote progress towards healthy functioning to help manage decrease symptoms associated with her diagnosis.   Reduce overall level, frequency, and intensity of the feelings of depression, anxiety and panic evidenced by decreased overall symptoms from 6 to 7 days/week to 0 to 1 days/week per client report for at least 3 consecutive months. Verbally express understanding of the relationship between feelings of  depression, anxiety and their  impact on thinking patterns and behaviors. Verbalize an understanding of the role that distorted thinking plays in creating fears, excessive worry, and ruminations.    Taylor [name] participated in the creation of the treatment plan)

## 2021-11-08 ENCOUNTER — Ambulatory Visit (INDEPENDENT_AMBULATORY_CARE_PROVIDER_SITE_OTHER): Payer: Medicare HMO | Admitting: Psychology

## 2021-11-08 DIAGNOSIS — F4310 Post-traumatic stress disorder, unspecified: Secondary | ICD-10-CM

## 2021-11-08 NOTE — Progress Notes (Signed)
[facility] Counselor/Therapist Progress Note  Patient ID: Taylor [name], MRN: [id number]   Date: 09/26/21  Time Spent: 3:37 pm - 4:33 Pm : 56 Minutes  Treatment Type: Individual Therapy.   Mental Status Exam: Appearance:  NA     Behavior: Appropriate  Motor: na  Speech/Language:  Clear and Coherent and Normal Rate  Affect: Flat  Mood: anxious and dysthymic  Thought process: normal  Thought content:   WNL  Sensory/Perceptual disturbances:   WNL  Orientation: oriented to person, place, time/date, and situation  Attention: Good  Concentration: Good  Memory: WNL  Fund of knowledge:  Good  Insight:   Good  Judgment:  Good  Impulse Control: Good   Risk Assessment: Danger to Self:  No Self-injurious Behavior: No Danger to Others: No Duty to Warn:no Physical Aggression / Violence:No  Access to Firearms a concern: No  Gang Involvement:No   Subjective:   Taylor [name] participated from car, via video and consented to treatment. Therapist participated from home office. We met online due to [place] pandemic. Taylor [name] reviewed the events of the past week.She noted meeting a new pain doctor and being hopeful that this might be an avenue to reduce her pain. She noted parenting stressors affecting her overall mood and functioning. She provided background regarding her daughter's unaddressed ADHD despite therapy and medication. We explored this during the session and worked on identifying specific stressors that she experiences day to day.Taylor [name] will work on creating a list of reoccurring stressors and we will work on processing this and problem-solving during our follow-up. Taylor [name] was engaged and motivated during the session and expressed commitment towards her goals.  Therapist provided supportive therapy   Interventions: Interpersonal, problem-solving.   Diagnosis:  PTSD (post-traumatic stress disorder)  Treatment Plan:  Client Abilities/Strengths Taylor [name] is forthcoming and  motivated for change.   Support System: Family and friends.   Client Treatment Preferences Outpatient Therapy.   Client Statement of Needs Taylor [name] would like to manage mood, process events, set boundaries for self and others, communicate positively and assertively, manage overall symptoms, increase frustration tolerance, improve self-talk, engage in self-care.    Treatment Level Weekly  Symptoms Depression: Sadness, lethargy, poor sleep, low motivation, irritability, negative self-talk, cognitive distortions (personalization).    (Status: maintained) Anxiety: Frequent worry, tension, difficulty managing anxiety, worrying about different things, feeling on edge.   (Status: maintained)  Goals:   Taylor [name] experiences symptoms of depression, anxiety, and PTSD.    Target Date: 09/27/22 Frequency: Weekly  Progress: 0 Modality: individual    Therapist will provide referrals for additional resources as appropriate.  Therapist will provide psycho-education regarding Taylor [name]'s diagnosis and corresponding treatment approaches and interventions. Licensed Clinical Social Worker, [place], LCSW will support the patient's ability to achieve the goals identified. will employ CBT, BA, Problem-solving, Solution Focused, Mindfulness,  coping skills, & other evidenced-based practices will be used to promote progress towards healthy functioning to help manage decrease symptoms associated with her diagnosis.   Reduce overall level, frequency, and intensity of the feelings of depression, anxiety and panic evidenced by decreased overall symptoms from 6 to 7 days/week to 0 to 1 days/week per client report for at least 3 consecutive months. Verbally express understanding of the relationship between feelings of depression, anxiety and their impact on thinking patterns and behaviors. Verbalize an understanding of the role that distorted thinking plays in creating fears, excessive worry, and  ruminations.    Taylor [name] participated in the creation of the treatment plan)

## 2021-11-22 ENCOUNTER — Ambulatory Visit: Payer: Medicare HMO | Admitting: Psychology

## 2021-11-29 ENCOUNTER — Ambulatory Visit: Payer: Medicare HMO | Admitting: Psychology

## 2021-12-20 ENCOUNTER — Ambulatory Visit (INDEPENDENT_AMBULATORY_CARE_PROVIDER_SITE_OTHER): Payer: Medicare HMO | Admitting: Psychology

## 2021-12-20 DIAGNOSIS — F4310 Post-traumatic stress disorder, unspecified: Secondary | ICD-10-CM | POA: Diagnosis not present

## 2021-12-20 NOTE — Progress Notes (Signed)
[facility] Counselor/Therapist Progress Note  Patient ID: SHAQUETA CASADY, MRN: [id number]   Date: 12/20/2021  Time Spent: 9:08 am -9:58 am : 50 Minutes  Treatment Type: Individual Therapy.   Mental Status Exam: Appearance:  NA     Behavior: Appropriate  Motor: na  Speech/Language:  Clear and Coherent and Normal Rate  Affect: Flat  Mood: anxious and dysthymic  Thought process: normal  Thought content:   WNL  Sensory/Perceptual disturbances:   WNL  Orientation: oriented to person, place, time/date, and situation  Attention: Good  Concentration: Good  Memory: WNL  Fund of knowledge:  Good  Insight:   Good  Judgment:  Good  Impulse Control: Good   Risk Assessment: Danger to Self:  No Self-injurious Behavior: No Danger to Others: No Duty to Warn:no Physical Aggression / Violence:No  Access to Firearms a concern: No  Gang Involvement:No   Subjective:   [name] participated from home, via phone and consented to treatment. Therapist participated from home office. We met online due to [place] pandemic. Sharese reviewed the events of the past week. She noted the recent discovery of her daughter's self-harming behavior which necessitated a 7 day hospitalization at a local behavioral health hospital. We explored her worry during the session and the frustration regarding her daughter's therapist lack of support. We processed Hedaya's feelings, work on identifying a course of action, and ways to continue managing her stressors. Therapist reaffirmed Jamala's positive feelings and cognitions. Therapist challenged Kiala's jumps to conclusions. Therapist encouraged Sophya to actively manage her symptoms and engage in relaxation. We scheduled a follow-up for continued treatment. Therapist provided supportive therapy   Interventions: Interpersonal, problem-solving.   Diagnosis:  PTSD (post-traumatic stress disorder)  Treatment Plan:  Client Abilities/Strengths Deeya is  forthcoming and motivated for change.   Support System: Family and friends.   Client Treatment Preferences Outpatient Therapy.   Client Statement of Needs Vernia would like to manage mood, process events, set boundaries for self and others, communicate positively and assertively, manage overall symptoms, increase frustration tolerance, improve self-talk, engage in self-care.    Treatment Level Weekly  Symptoms Depression: Sadness, lethargy, poor sleep, low motivation, irritability, negative self-talk, cognitive distortions (personalization).    (Status: maintained) Anxiety: Frequent worry, tension, difficulty managing anxiety, worrying about different things, feeling on edge.   (Status: maintained)  Goals:   Kinzleigh experiences symptoms of depression, anxiety, and PTSD.    Target Date: 09/27/22 Frequency: Weekly  Progress: 0 Modality: individual    Therapist will provide referrals for additional resources as appropriate.  Therapist will provide psycho-education regarding Destanie's diagnosis and corresponding treatment approaches and interventions. Licensed Clinical Social Worker, [place], LCSW will support the patient's ability to achieve the goals identified. will employ CBT, BA, Problem-solving, Solution Focused, Mindfulness,  coping skills, & other evidenced-based practices will be used to promote progress towards healthy functioning to help manage decrease symptoms associated with her diagnosis.   Reduce overall level, frequency, and intensity of the feelings of depression, anxiety and panic evidenced by decreased overall symptoms from 6 to 7 days/week to 0 to 1 days/week per client report for at least 3 consecutive months. Verbally express understanding of the relationship between feelings of depression, anxiety and their impact on thinking patterns and behaviors. Verbalize an understanding of the role that distorted thinking plays in creating fears, excessive worry, and  ruminations.    [name] participated in the creation of the treatment plan)

## 2022-01-08 ENCOUNTER — Ambulatory Visit (INDEPENDENT_AMBULATORY_CARE_PROVIDER_SITE_OTHER): Payer: Medicare HMO | Admitting: Psychology

## 2022-01-08 DIAGNOSIS — F4310 Post-traumatic stress disorder, unspecified: Secondary | ICD-10-CM | POA: Diagnosis not present

## 2022-01-08 NOTE — Progress Notes (Signed)
[facility] Counselor/Therapist Progress Note  Patient ID: Taylor [name], MRN: [id number]   Date: 01/08/2022  Time Spent: 10:08 am - 11:02 am : 66 Minutes  Treatment Type: Individual Therapy.   Mental Status Exam: Appearance:  NA     Behavior: Appropriate  Motor: na  Speech/Language:  Clear and Coherent and Normal Rate  Affect: Flat  Mood: anxious and dysthymic  Thought process: normal  Thought content:   WNL  Sensory/Perceptual disturbances:   WNL  Orientation: oriented to person, place, time/date, and situation  Attention: Good  Concentration: Good  Memory: WNL  Fund of knowledge:  Good  Insight:   Good  Judgment:  Good  Impulse Control: Good   Risk Assessment: Danger to Self:  No Self-injurious Behavior: No Danger to Others: No Duty to Warn:no Physical Aggression / Violence:No  Access to Firearms a concern: No  Gang Involvement:No   Subjective:   Taylor [name] participated from car, via video and consented to treatment. Therapist participated from home office. We met online due to [place] pandemic. Taylor [name] reviewed the events of the past week. She noted being fatigued every day. She noted worry about her daughter's ADHD, increased sugar consumption, and impulsivity. She noted her daughter's recent switch of therapists and is hopeful that her behavior and symptom management improves. She noted frustration regarding the lack of traction regarding numerous interventions she has attempted at home. Therapist provided psycho-education regarding ADHD and symptom management. Therapist provided Taylor [name] resources for parenting ADHD children. Therapist encouraged Taylor [name] to continue her work with boundaries and positive reinforcement with smaller tasks and more immediate reinforcement. Taylor [name] was engaged and motivated during the session. We scheduled a follow-up for continued treatment. Therapist provided supportive therapy   Interventions: Interpersonal, problem-solving.    Diagnosis:  PTSD (post-traumatic stress disorder)  Treatment Plan:  Client Abilities/Strengths Taylor [name] is forthcoming and motivated for change.   Support System: Family and friends.   Client Treatment Preferences Outpatient Therapy.   Client Statement of Needs Taylor [name] would like to manage mood, process events, set boundaries for self and others, communicate positively and assertively, manage overall symptoms, increase frustration tolerance, improve self-talk, engage in self-care.    Treatment Level Weekly  Symptoms Depression: Sadness, lethargy, poor sleep, low motivation, irritability, negative self-talk, cognitive distortions (personalization).    (Status: maintained) Anxiety: Frequent worry, tension, difficulty managing anxiety, worrying about different things, feeling on edge.   (Status: maintained)  Goals:   [name] experiences symptoms of depression, anxiety, and PTSD.    Target Date: 09/27/22 Frequency: Weekly  Progress: 0 Modality: individual    Therapist will provide referrals for additional resources as appropriate.  Therapist will provide psycho-education regarding Taylor [name]'s diagnosis and corresponding treatment approaches and interventions. Licensed Clinical Social Worker, [place], LCSW will support the patient's ability to achieve the goals identified. will employ CBT, BA, Problem-solving, Solution Focused, Mindfulness,  coping skills, & other evidenced-based practices will be used to promote progress towards healthy functioning to help manage decrease symptoms associated with her diagnosis.   Reduce overall level, frequency, and intensity of the feelings of depression, anxiety and panic evidenced by decreased overall symptoms from 6 to 7 days/week to 0 to 1 days/week per client report for at least 3 consecutive months. Verbally express understanding of the relationship between feelings of depression, anxiety and their impact on thinking patterns and  behaviors. Verbalize an understanding of the role that distorted thinking plays in creating fears, excessive worry, and ruminations.    Taylor [name] participated  in the creation of the treatment plan)

## 2022-02-10 ENCOUNTER — Ambulatory Visit (INDEPENDENT_AMBULATORY_CARE_PROVIDER_SITE_OTHER): Payer: Medicare HMO | Admitting: Psychology

## 2022-02-10 DIAGNOSIS — F4310 Post-traumatic stress disorder, unspecified: Secondary | ICD-10-CM | POA: Diagnosis not present

## 2022-02-10 NOTE — Progress Notes (Signed)
[facility] Counselor/Therapist Progress Note  Patient ID: Taylor [name], MRN: [id number]   Date: 02/10/2022  Time Spent: 2:05 pm - 2:50 am : 45 Minutes  Treatment Type: Individual Therapy.   Mental Status Exam: Appearance:  NA     Behavior: Appropriate  Motor: na  Speech/Language:  Clear and Coherent and Normal Rate  Affect: Flat  Mood: anxious and dysthymic  Thought process: normal  Thought content:   WNL  Sensory/Perceptual disturbances:   WNL  Orientation: oriented to person, place, time/date, and situation  Attention: Good  Concentration: Good  Memory: WNL  Fund of knowledge:  Good  Insight:   Good  Judgment:  Good  Impulse Control: Good   Risk Assessment: Danger to Self:  No Self-injurious Behavior: No Danger to Others: No Duty to Warn:no Physical Aggression / Violence:No  Access to Firearms a concern: No  Gang Involvement:No   Subjective:   Taylor [name] participated from home, via phone and consented to treatment. Therapist participated from office. We met online due to [place] pandemic. Taylor [name] reviewed the events of the past week. She noted difficulty staying on task and noted this affecting her insurance enrollment process. She noted difficulty staying organized despite her efforts. We worked on problem solving ways to address this. She noted varying options including using her phone, employing a daily planner, or using a tablet. We worked on identifying barriers to staying organized and ways to address these barriers consistently and meaningfully. Therapist encouraged problem-solving and continued concentration and effort in this are. Taylor [name] noted the distress she experiences with missed appointment, missed appointment fees, and delays in follow-ups as a result. Taylor [name] was engaged and motivated during the session. We scheduled a follow-up for continued treatment. Therapist provided supportive therapy  Interventions: problem-solving.   Diagnosis:  PTSD  (post-traumatic stress disorder)  Treatment Plan:  Client Abilities/Strengths Taylor [name] is forthcoming and motivated for change.   Support System: Family and friends.   Client Treatment Preferences Outpatient Therapy.   Client Statement of Needs Taylor [name] would like to manage mood, process events, set boundaries for self and others, communicate positively and assertively, manage overall symptoms, increase frustration tolerance, improve self-talk, engage in self-care.    Treatment Level Weekly  Symptoms Depression: Sadness, lethargy, poor sleep, low motivation, irritability, negative self-talk, cognitive distortions (personalization).    (Status: maintained) Anxiety: Frequent worry, tension, difficulty managing anxiety, worrying about different things, feeling on edge.   (Status: maintained)  Goals:   Taylor [name] experiences symptoms of depression, anxiety, and PTSD.    Target Date: 09/27/22 Frequency: Weekly  Progress: 0 Modality: individual    Therapist will provide referrals for additional resources as appropriate.  Therapist will provide psycho-education regarding Taylor [name]'s diagnosis and corresponding treatment approaches and interventions. Licensed Clinical Social Worker, [name], LCSW will support the patient's ability to achieve the goals identified. will employ CBT, BA, Problem-solving, Solution Focused, Mindfulness,  coping skills, & other evidenced-based practices will be used to promote progress towards healthy functioning to help manage decrease symptoms associated with her diagnosis.   Reduce overall level, frequency, and intensity of the feelings of depression, anxiety and panic evidenced by decreased overall symptoms from 6 to 7 days/week to 0 to 1 days/week per client report for at least 3 consecutive months. Verbally express understanding of the relationship between feelings of depression, anxiety and their impact on thinking patterns and behaviors. Verbalize an understanding  of the role that distorted thinking plays in creating fears, excessive worry, and ruminations.    (  Taylor [name] participated in the creation of the treatment plan)

## 2022-02-24 ENCOUNTER — Ambulatory Visit (INDEPENDENT_AMBULATORY_CARE_PROVIDER_SITE_OTHER): Payer: Medicare HMO | Admitting: Psychology

## 2022-02-24 DIAGNOSIS — F4310 Post-traumatic stress disorder, unspecified: Secondary | ICD-10-CM | POA: Diagnosis not present

## 2022-02-24 NOTE — Progress Notes (Signed)
[facility] Counselor/Therapist Progress Note  Patient ID: GIGI ONSTAD, MRN: [id number]   Date: 02/24/2022  Time Spent: 2:08 pm - 2:57 pm : 49 Minutes  Treatment Type: Individual Therapy.   Mental Status Exam: Appearance:  NA     Behavior: Appropriate  Motor: na  Speech/Language:  Clear and Coherent and Normal Rate  Affect: Flat  Mood: anxious and dysthymic  Thought process: normal  Thought content:   WNL  Sensory/Perceptual disturbances:   WNL  Orientation: oriented to person, place, time/date, and situation  Attention: Good  Concentration: Good  Memory: WNL  Fund of knowledge:  Good  Insight:   Good  Judgment:  Good  Impulse Control: Good   Risk Assessment: Danger to Self:  No Self-injurious Behavior: No Danger to Others: No Duty to Warn:no Physical Aggression / Violence:No  Access to Firearms a concern: No  Gang Involvement:No   Subjective:   [name] participated from home, via phone and consented to treatment. Therapist participated from office. We met online due to [place] pandemic. Emeline reviewed the events of the past week. She noted continued stressors with her daughter in relation to being bullied. We explored this during the session and identified her feelings during the session. She discussed difficulty with having Simone complete tasks in the home, follow-directions, and maintain accountability and consistency. We discussed ways to create rules, communicate clearly concisely and ensure that the rules are reasonable and attainable. We discussed the importance of identifying of the ability to earn rewards and lose rewards. Therapist modeled this and discussed the importance of limiting morning time dopamine seeking behavior, earning usage of electronics, and monitoring usage. We discussed the possible benefits for Barbaraann to take this approach including reducing her own stressors as she disciplines and redirects [place]. Nirel was engaged and motivated  during the session. We scheduled a follow-up for continued treatment. Therapist provided supportive therapy  Interventions: problem-solving  Diagnosis:  PTSD (post-traumatic stress disorder)  Treatment Plan:  Client Abilities/Strengths Tyja is forthcoming and motivated for change.   Support System: Family and friends.   Client Treatment Preferences Outpatient Therapy.   Client Statement of Needs Yina would like to manage mood, process events, set boundaries for self and others, communicate positively and assertively, manage overall symptoms, increase frustration tolerance, improve self-talk, engage in self-care.    Treatment Level Weekly  Symptoms Depression: Sadness, lethargy, poor sleep, low motivation, irritability, negative self-talk, cognitive distortions (personalization).    (Status: maintained) Anxiety: Frequent worry, tension, difficulty managing anxiety, worrying about different things, feeling on edge.   (Status: maintained)  Goals:   Leen experiences symptoms of depression, anxiety, and PTSD.    Target Date: 09/27/22 Frequency: Weekly  Progress: 0 Modality: individual    Therapist will provide referrals for additional resources as appropriate.  Therapist will provide psycho-education regarding Yenny's diagnosis and corresponding treatment approaches and interventions. Licensed Clinical Social Worker, [place], LCSW will support the patient's ability to achieve the goals identified. will employ CBT, BA, Problem-solving, Solution Focused, Mindfulness,  coping skills, & other evidenced-based practices will be used to promote progress towards healthy functioning to help manage decrease symptoms associated with her diagnosis.   Reduce overall level, frequency, and intensity of the feelings of depression, anxiety and panic evidenced by decreased overall symptoms from 6 to 7 days/week to 0 to 1 days/week per client report for at least 3 consecutive months. Verbally  express understanding of the relationship between feelings of depression, anxiety and their impact  on thinking patterns and behaviors. Verbalize an understanding of the role that distorted thinking plays in creating fears, excessive worry, and ruminations.    [name] participated in the creation of the treatment plan)   [name], LCSW

## 2022-03-10 ENCOUNTER — Ambulatory Visit (INDEPENDENT_AMBULATORY_CARE_PROVIDER_SITE_OTHER): Payer: Medicare HMO | Admitting: Psychology

## 2022-03-10 DIAGNOSIS — F4310 Post-traumatic stress disorder, unspecified: Secondary | ICD-10-CM

## 2022-03-10 NOTE — Progress Notes (Signed)
[facility] Counselor/Therapist Progress Note  Patient ID: Taylor [name], MRN: [id number]   Date: 03/10/2022  Time Spent: 2:35 pm - 3:13 pm : 38 Minutes  Treatment Type: Individual Therapy.   Mental Status Exam: Appearance:  NA     Behavior: Appropriate  Motor: na  Speech/Language:  Clear and Coherent and Normal Rate  Affect: Flat  Mood: anxious and dysthymic  Thought process: normal  Thought content:   WNL  Sensory/Perceptual disturbances:   WNL  Orientation: oriented to person, place, time/date, and situation  Attention: Good  Concentration: Good  Memory: WNL  Fund of knowledge:  Good  Insight:   Good  Judgment:  Good  Impulse Control: Good   Risk Assessment: Danger to Self:  No Self-injurious Behavior: No Danger to Others: No Duty to Warn:no Physical Aggression / Violence:No  Access to Firearms a concern: No  Gang Involvement:No   Subjective:   [name] participated from home, via phone and consented to treatment. Therapist participated from office. We met online due to [place] pandemic. Porfiria reviewed the events of the past week. She noted her attempts to establish a healthy and consistent morning routine for her daughter, Naaman Plummer. She noted her daughter's not responding to the structure as Itzy would have hoped. We explored this and ways to give feedback with boundary and assertiveness. We discussed the use of rewards and discipline and discussed the importance of clear and concise communication. Therapist encouraged Eliotte to outline the rules, on paper, and place in her daughter's room. We discussed boundaries regarding conversations regarding decided upon topics. Therapist modeled this during the session. We discussed the costs to [facility] for continued engagement regarding continual discussions regarding parenting decisions after the initial discussion. Maddelynn was engaged and motivated during the session. We scheduled a follow-up for continued treatment.  Therapist provided supportive therapy  Interventions: problem-solving & CBT  Diagnosis:  PTSD (post-traumatic stress disorder)  Treatment Plan:  Client Abilities/Strengths Jonikka is forthcoming and motivated for change.   Support System: Family and friends.   Client Treatment Preferences Outpatient Therapy.   Client Statement of Needs Inabelle would like to manage mood, process events, set boundaries for self and others, communicate positively and assertively, manage overall symptoms, increase frustration tolerance, improve self-talk, engage in self-care.    Treatment Level Weekly  Symptoms Depression: Sadness, lethargy, poor sleep, low motivation, irritability, negative self-talk, cognitive distortions (personalization).    (Status: maintained) Anxiety: Frequent worry, tension, difficulty managing anxiety, worrying about different things, feeling on edge.   (Status: maintained)  Goals:   Jada experiences symptoms of depression, anxiety, and PTSD.    Target Date: 09/27/22 Frequency: Weekly  Progress: 0 Modality: individual    Therapist will provide referrals for additional resources as appropriate.  Therapist will provide psycho-education regarding Zanaya's diagnosis and corresponding treatment approaches and interventions. Licensed Clinical Social Worker, [place], LCSW will support the patient's ability to achieve the goals identified. will employ CBT, BA, Problem-solving, Solution Focused, Mindfulness,  coping skills, & other evidenced-based practices will be used to promote progress towards healthy functioning to help manage decrease symptoms associated with her diagnosis.   Reduce overall level, frequency, and intensity of the feelings of depression, anxiety and panic evidenced by decreased overall symptoms from 6 to 7 days/week to 0 to 1 days/week per client report for at least 3 consecutive months. Verbally express understanding of the relationship between feelings of  depression, anxiety and their impact on thinking patterns and behaviors. Verbalize an understanding  of the role that distorted thinking plays in creating fears, excessive worry, and ruminations.    [name] participated in the creation of the treatment plan)   [name], LCSW

## 2022-03-24 ENCOUNTER — Ambulatory Visit (INDEPENDENT_AMBULATORY_CARE_PROVIDER_SITE_OTHER): Payer: Medicare HMO | Admitting: Psychology

## 2022-03-24 DIAGNOSIS — F4310 Post-traumatic stress disorder, unspecified: Secondary | ICD-10-CM | POA: Diagnosis not present

## 2022-03-24 NOTE — Progress Notes (Signed)
[facility] Counselor/Therapist Progress Note  Patient ID: Taylor [name], MRN: [id number]   Date: 03/24/2022  Time Spent: 1:04 pm - 1:59 pm : 55 Minutes  Treatment Type: Individual Therapy.   Mental Status Exam: Appearance:  Well Groomed     Behavior: Appropriate  Motor: Normal  Speech/Language:  Clear and Coherent and Normal Rate  Affect: Flat  Mood: anxious and dysthymic  Thought process: normal  Thought content:   WNL  Sensory/Perceptual disturbances:   WNL  Orientation: oriented to person, place, time/date, and situation  Attention: Good  Concentration: Good  Memory: WNL  Fund of knowledge:  Good  Insight:   Good  Judgment:  Good  Impulse Control: Good   Risk Assessment: Danger to Self:  No Self-injurious Behavior: No Danger to Others: No Duty to Warn:no Physical Aggression / Violence:No  Access to Firearms a concern: No  Gang Involvement:No   Subjective:   Taylor [name] participated from home, via video and consented to treatment. Therapist participated from office. We met online due to [place] pandemic. Taylor [name] reviewed the events of the past week. She noted continued struggles with getting her daughter to follow directions and complete the necessary daily tasks assigned. She noted her daughter's struggles with respecting the belongings of others. Taylor [name] noted the stressors she experiences as a result of her daughter's poor choices. She noted stressors and frustration regarding her daughter's behaviors and choice. We worked on manage her own anxiety, focusing on setting reasonable and clearly communicated expectations. We discussed focusing on the the foundational changes she is hoping to see, using positive and negative reinforcers, and reducing focus on non-foundational concerns for the time-being. Therapist encouraged Taylor [name] to identify these goals, communicate  them to her daughter, and work on her own distress tolerance in relation to non-foundational issues.  Taylor [name] was engaged and motivated during the session. We scheduled a follow-up for continued treatment. Therapist provided supportive therapy  Interventions: problem-solving & CBT  Diagnosis:  PTSD (post-traumatic stress disorder)  Treatment Plan:  Client Abilities/Strengths Taylor [name] is forthcoming and motivated for change.   Support System: Family and friends.   Client Treatment Preferences Outpatient Therapy.   Client Statement of Needs Taylor [name] would like to manage mood, process events, set boundaries for self and others, communicate positively and assertively, manage overall symptoms, increase frustration tolerance, improve self-talk, engage in self-care.    Treatment Level Weekly  Symptoms Depression: Sadness, lethargy, poor sleep, low motivation, irritability, negative self-talk, cognitive distortions (personalization).    (Status: maintained) Anxiety: Frequent worry, tension, difficulty managing anxiety, worrying about different things, feeling on edge.   (Status: maintained)  Goals:   Taylor [name] experiences symptoms of depression, anxiety, and PTSD.    Target Date: 09/27/22 Frequency: Weekly  Progress: 0 Modality: individual    Therapist will provide referrals for additional resources as appropriate.  Therapist will provide psycho-education regarding Taylor [name] diagnosis and corresponding treatment approaches and interventions. Licensed Clinical Social Worker, [place], LCSW will support the patient's ability to achieve the goals identified. will employ CBT, BA, Problem-solving, Solution Focused, Mindfulness,  coping skills, & other evidenced-based practices will be used to promote progress towards healthy functioning to help manage decrease symptoms associated with her diagnosis.   Reduce overall level, frequency, and intensity of the feelings of depression, anxiety and panic evidenced by decreased overall symptoms from 6 to 7 days/week to 0 to 1 days/week per client report for at  least 3 consecutive months. Verbally express understanding of the relationship between feelings  of depression, anxiety and their impact on thinking patterns and behaviors. Verbalize an understanding of the role that distorted thinking plays in creating fears, excessive worry, and ruminations.    Taylor [name] participated in the creation of the treatment plan)   Taylor Irish, LCSW

## 2022-04-07 ENCOUNTER — Ambulatory Visit (INDEPENDENT_AMBULATORY_CARE_PROVIDER_SITE_OTHER): Payer: Medicare HMO | Admitting: Psychology

## 2022-04-07 DIAGNOSIS — F4310 Post-traumatic stress disorder, unspecified: Secondary | ICD-10-CM
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
## 2022-04-23 ENCOUNTER — Encounter: Payer: Medicare HMO | Admitting: Psychology

## 2022-04-23 NOTE — Progress Notes (Signed)
This encounter was created in error - please disregard.
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
## 2022-05-07 ENCOUNTER — Ambulatory Visit (INDEPENDENT_AMBULATORY_CARE_PROVIDER_SITE_OTHER): Payer: Medicare HMO | Admitting: Psychology

## 2022-05-07 DIAGNOSIS — F4310 Post-traumatic stress disorder, unspecified: Secondary | ICD-10-CM

## 2022-05-07 NOTE — Progress Notes (Signed)
[facility] Counselor/Therapist Progress Note  Patient ID: Taylor [name], MRN: [id number]   Date: 05/07/2022  Time Spent: 3:04 pm - 4:02 pm : 58 Minutes  Treatment Type: Individual Therapy.   Mental Status Exam: Appearance:  Casual     Behavior: Appropriate  Motor: Normal  Speech/Language:  Clear and Coherent and Normal Rate  Affect: Flat  Mood: anxious and dysthymic  Thought process: normal  Thought content:   WNL  Sensory/Perceptual disturbances:   WNL  Orientation: oriented to person, place, time/date, and situation  Attention: Good  Concentration: Good  Memory: WNL  Fund of knowledge:  Good  Insight:   Good  Judgment:  Good  Impulse Control: Good   Risk Assessment: Danger to Self:  No Self-injurious Behavior: No Danger to Others: No Duty to Warn:no Physical Aggression / Violence:No  Access to Firearms a concern: No  Gang Involvement:No   Subjective:   Taylor [name] participated from home, via video and consented to treatment. Therapist participated from office. We met online due to COVID pandemic. Taylor [name] reviewed the events of the past week. She noted worry about her sister, who is blind, in regards to her housing. She noted working on advocating for her sister but noted that her sister does not live nearby. She noted her sister noted receiving unreasonable complaints from her housing. She noted feeling frustration and noted being "bummed out". She noted frustration regarding bureaucratic issues with DSS. We explored her frustration during the session and her attempts to address these concerns. We engaged in problem-solving and discussed communication and assertiveness. Therapist modeled this during the session. Therapist encouraged self-care, taking breaks from stressors, and focusing on positives.  Therapist validated and normalized Taylor [name]'s feelings and experience during the session. A follow-up was scheduled for continued treatment which Aundra continues to  benefit from. Therapist provided supportive therapy.   Interventions: interpersonal  Diagnosis:  PTSD (post-traumatic stress disorder)  Treatment Plan:  Client Abilities/Strengths Taylor [name] is forthcoming and motivated for change.   Support System: Family and friends.   Client Treatment Preferences Outpatient Therapy.   Client Statement of Needs Taylor [name] would like to manage mood, process events, set boundaries for self and others, communicate positively and assertively, manage overall symptoms, increase frustration tolerance, improve self-talk, engage in self-care.    Treatment Level Weekly  Symptoms Depression: Sadness, lethargy, poor sleep, low motivation, irritability, negative self-talk, cognitive distortions (personalization).    (Status: maintained) Anxiety: Frequent worry, tension, difficulty managing anxiety, worrying about different things, feeling on edge.   (Status: declined)  Goals:   Taylor [name] experiences symptoms of depression, anxiety, and PTSD.    Target Date: 09/27/22 Frequency: Weekly  Progress: 0 Modality: individual    Therapist will provide referrals for additional resources as appropriate.  Therapist will provide psycho-education regarding [name]'s diagnosis and corresponding treatment approaches and interventions. Licensed Clinical Social Worker, [place], LCSW will support the patient's ability to achieve the goals identified. will employ CBT, BA, Problem-solving, Solution Focused, Mindfulness,  coping skills, & other evidenced-based practices will be used to promote progress towards healthy functioning to help manage decrease symptoms associated with her diagnosis.   Reduce overall level, frequency, and intensity of the feelings of depression, anxiety and panic evidenced by decreased overall symptoms from 6 to 7 days/week to 0 to 1 days/week per client report for at least 3 consecutive months. Verbally express understanding of the relationship between  feelings of depression, anxiety and their impact on thinking patterns and behaviors. Verbalize an understanding  of the role that distorted thinking plays in creating fears, excessive worry, and ruminations.    Taylor [name] participated in the creation of the treatment plan)   Taylor Irish, LCSW

## 2022-05-22 ENCOUNTER — Encounter: Payer: Medicare HMO | Admitting: Psychology

## 2022-05-22 NOTE — Progress Notes (Signed)
This encounter was created in error - please disregard.

## 2022-06-06 ENCOUNTER — Ambulatory Visit (INDEPENDENT_AMBULATORY_CARE_PROVIDER_SITE_OTHER): Payer: Medicare HMO | Admitting: Psychology

## 2022-06-06 DIAGNOSIS — F4310 Post-traumatic stress disorder, unspecified: Secondary | ICD-10-CM

## 2022-06-06 NOTE — Progress Notes (Signed)
[facility] Counselor/Therapist Progress Note  Patient ID: Taylor [name], MRN: [id number]   Date: 06/06/2022  Time Spent: 1:36 pm - 2:29 pm : 58 Minutes  Treatment Type: Individual Therapy.   Mental Status Exam: Appearance:  Casual     Behavior: Appropriate  Motor: Normal  Speech/Language:  Clear and Coherent and Normal Rate  Affect: Flat  Mood: anxious and dysthymic  Thought process: normal  Thought content:   WNL  Sensory/Perceptual disturbances:   WNL  Orientation: oriented to person, place, time/date, and situation  Attention: Good  Concentration: Good  Memory: WNL  Fund of knowledge:  Good  Insight:   Good  Judgment:  Good  Impulse Control: Good   Risk Assessment: Danger to Self:  No Self-injurious Behavior: No Danger to Others: No Duty to Warn:no Physical Aggression / Violence:No  Access to Firearms a concern: No  Gang Involvement:No   Subjective:   Gae Dry participated from home, via video and consented to treatment. Therapist participated from office. We met online due to COVID pandemic.We switched to audio due to technical difficulties. Avonna reviewed the events of the past week. Kennis noted difficulty with getting her medication refills for her psychotropic medication due to her current provider no longer being paneled on [employer]. We worked on identifying possible resources for psychiatric treatment. However, she noted being slated for a psychiatric consult with Merian Capron, MSN, PMHNP-BC with [facility] at the end of May. She endorsed increased anxiety and panic symptoms. We discussed the importance of engaging in self-care, proactive symptom management, and relaxation. Therapist provided relaxation exercises: 4/7/8 breathing, progressive muscle relaxation, guided mediation to proactive between sessions. Therapist encouraged Gizell to contact her previous provider for a refill of medication and to contact the [facility] to see if they can aid in this area, as well. Therapist validated and normalized Sharryn's feelings and experience during the session. Therapist provided supportive therapy.   Interventions: CBT  Diagnosis:  PTSD (post-traumatic stress disorder)  Treatment Plan:  Client Abilities/Strengths Regan is forthcoming and motivated for change.   Support System: Family and friends.   Client Treatment Preferences Outpatient Therapy.   Client Statement of Needs Naiah would like to manage mood, process events, set boundaries for self and others, communicate positively and assertively, manage overall symptoms, increase frustration tolerance, improve self-talk, engage in self-care.    Treatment Level Weekly  Symptoms Depression: Sadness, lethargy, poor sleep, low motivation, irritability, negative self-talk, cognitive distortions (personalization).    (Status: maintained) Anxiety: Frequent worry, tension, difficulty managing anxiety, worrying about different things, feeling on edge.   (Status: declined)  Goals:   Seleta experiences symptoms of depression, anxiety, and PTSD.    Target Date: 09/27/22 Frequency: Weekly  Progress: 0 Modality: individual    Therapist will provide referrals for additional resources as appropriate.  Therapist will provide psycho-education regarding Alexsys's diagnosis and corresponding treatment approaches and interventions. Licensed Clinical Social Worker, [place], LCSW will support the patient's ability to achieve the goals identified. will employ CBT, BA, Problem-solving, Solution Focused, Mindfulness,  coping skills, & other evidenced-based practices will be used to promote progress towards healthy functioning to help manage decrease symptoms associated with her diagnosis.   Reduce overall level, frequency, and intensity of the feelings of depression, anxiety and panic evidenced by decreased overall symptoms from 6 to 7 days/week to 0 to 1 days/week  per client report for at least 3 consecutive months. Verbally express understanding of the relationship  between feelings of depression, anxiety and their impact on thinking patterns and behaviors. Verbalize an understanding of the role that distorted thinking plays in creating fears, excessive worry, and ruminations.    Mickie Bail participated in the creation of the treatment plan)   Delight Ovens, LCSW

## 2022-06-20 ENCOUNTER — Ambulatory Visit (INDEPENDENT_AMBULATORY_CARE_PROVIDER_SITE_OTHER): Payer: Medicare HMO | Admitting: Psychology

## 2022-06-20 DIAGNOSIS — F4310 Post-traumatic stress disorder, unspecified: Secondary | ICD-10-CM
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
## 2022-07-04 ENCOUNTER — Ambulatory Visit (INDEPENDENT_AMBULATORY_CARE_PROVIDER_SITE_OTHER): Payer: Medicare HMO | Admitting: Psychology

## 2022-07-04 DIAGNOSIS — F4310 Post-traumatic stress disorder, unspecified: Secondary | ICD-10-CM
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
## 2022-07-18 ENCOUNTER — Encounter: Payer: Medicare HMO | Admitting: Psychology

## 2022-07-18 NOTE — Progress Notes (Deleted)
[facility] Counselor/Therapist Progress Note  Patient ID: Taylor [name], MRN: [id number]   Date: 07/18/2022  Time Spent: 12:16 pm - 1:00 pm : 44 Minutes  Treatment Type: Individual Therapy.   Mental Status Exam: Appearance:  Casual     Behavior: Appropriate  Motor: Normal  Speech/Language:  Clear and Coherent and Normal Rate  Affect: Flat  Mood: anxious and dysthymic  Thought process: normal  Thought content:   WNL  Sensory/Perceptual disturbances:   WNL  Orientation: oriented to person, place, time/date, and situation  Attention: Good  Concentration: Good  Memory: WNL  Fund of knowledge:  Good  Insight:   Good  Judgment:  Good  Impulse Control: Good   Risk Assessment: Danger to Self:  No Self-injurious Behavior: No Danger to Others: No Duty to Warn:no Physical Aggression / Violence:No  Access to Firearms a concern: No  Gang Involvement:No   Subjective:   Taylor [name] participated from home, via video and consented to treatment and is aware of the limitations of teletherapy. Therapist participated from office. We met online due to COVID pandemic.We switched to audio due to technical difficulties. Taylor [name] reviewed the events of the past week. She noted her new medication regimen Lamotrigine 25 mg and will be titrated to 50 mg and a Trazadone. She denied any concerns regarding side-effects but noted worry that she might develop side-effects. Therapist encouraged Taylor [name] to discuss concerns with her prescriber. She noted people having difficulty "understand what I am saying". We worked on exploring this during the session. She noted this creating stress as she works on addressing her daughter's concerns academically. She noted worry that she is seen as "an aggressive angry black woman". We worked on exploring this during the session and will do this going forward. Therapist validated and normalized Taylor [name]'s feelings and experience and provided supportive therapy. We  scheduled a follow-up for continued treatment.   Interventions: CBT & interpersonal  Diagnosis:  No diagnosis found.  Treatment Plan:  Client Abilities/Strengths Taylor [name] is forthcoming and motivated for change.   Support System: Family and friends.   Client Treatment Preferences Outpatient Therapy.   Client Statement of Needs Taylor [name] would like to manage mood, process events, set boundaries for self and others, communicate positively and assertively, manage overall symptoms, increase frustration tolerance, improve self-talk, engage in self-care.    Treatment Level Weekly  Symptoms Depression: Sadness, lethargy, poor sleep, low motivation, irritability, negative self-talk, cognitive distortions (personalization).    (Status: maintained) Anxiety: Frequent worry, tension, difficulty managing anxiety, worrying about different things, feeling on edge.   (Status: maintained)  Goals:   Taylor [name] experiences symptoms of depression, anxiety, and PTSD.    Target Date: 09/27/22 Frequency: Weekly  Progress: 0 Modality: individual    Therapist will provide referrals for additional resources as appropriate.  Therapist will provide psycho-education regarding Taylor [name]'s diagnosis and corresponding treatment approaches and interventions. Licensed Clinical Social Worker, [place], LCSW will support the patient's ability to achieve the goals identified. will employ CBT, BA, Problem-solving, Solution Focused, Mindfulness,  coping skills, & other evidenced-based practices will be used to promote progress towards healthy functioning to help manage decrease symptoms associated with her diagnosis.   Reduce overall level, frequency, and intensity of the feelings of depression, anxiety and panic evidenced by decreased overall symptoms from 6 to 7 days/week to 0 to 1 days/week per client report for at least 3 consecutive months. Verbally express understanding of the relationship between feelings of depression,  anxiety and their impact  on thinking patterns and behaviors. Verbalize an understanding of the role that distorted thinking plays in creating fears, excessive worry, and ruminations.    Taylor [name] participated in the creation of the treatment plan)   [name], LCSW

## 2022-07-18 NOTE — Progress Notes (Signed)
This encounter was created in error - please disregard.

## 2022-08-01 ENCOUNTER — Ambulatory Visit: Payer: Medicare HMO | Admitting: Psychology

## 2022-08-01 DIAGNOSIS — F4310 Post-traumatic stress disorder, unspecified: Secondary | ICD-10-CM | POA: Diagnosis not present
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
## 2022-08-15 ENCOUNTER — Ambulatory Visit (INDEPENDENT_AMBULATORY_CARE_PROVIDER_SITE_OTHER): Payer: Medicare HMO | Admitting: Psychology

## 2022-08-15 DIAGNOSIS — F4310 Post-traumatic stress disorder, unspecified: Secondary | ICD-10-CM

## 2022-08-15 NOTE — Progress Notes (Signed)
[facility] Counselor/Therapist Progress Note  Patient ID: Taylor [name], MRN: [id number]   Date: 08/15/2022  Time Spent: 12:06 pm - 12:56 pm : 50 Minutes  Treatment Type: Individual Therapy.   Mental Status Exam: Appearance:  Casual     Behavior: Appropriate  Motor: Normal  Speech/Language:  Clear and Coherent and Normal Rate  Affect: Flat  Mood: anxious and dysthymic  Thought process: normal  Thought content:   WNL  Sensory/Perceptual disturbances:   WNL  Orientation: oriented to person, place, time/date, and situation  Attention: Good  Concentration: Good  Memory: WNL  Fund of knowledge:  Good  Insight:   Good  Judgment:  Good  Impulse Control: Good   Risk Assessment: Danger to Self:  No Self-injurious Behavior: No Danger to Others: No Duty to Warn:no Physical Aggression / Violence:No  Access to Firearms a concern: No  Gang Involvement:No   Subjective:   Taylor [name] participated from car, via video and consented to treatment and is aware of the limitations of teletherapy. Therapist participated from home office. She noted recently having surgery and noted it going well, overall. She noted being limited as a result as she heals from surgery. She noted feeling frustrated and stressed due to attempting to help her sister with paperwork and experiencing barriers. She noted meeting with her psychiatric provider and noted an increase in her lamotrigine. She will have a follow-up, in the future, for a follow-up. We continued to process the loss of her father. She noted continued feelings of grief and noted feeling that she was 'letting him down". We processed this. She noted consistent stress with her siblings after their father's passing and could not identify a reason for this stress. She noted the effect of this on her mood. She noted this stress being difficult to manage when she is spending time with her mother and her siblings are around. She noted being in  charge of her siblings at a young age. We worked on boundary setting for self and others, focusing on positive relationships, and maintaining distance from stressors. Taylor [name] was engaged and motivated during the session and expressed commitment towards goals. Therapist praised Taylor [name] and provided supportive therapy. A follow-up was scheduled for continued treatment.   Interventions: CBT & interpersonal  Diagnosis:  PTSD (post-traumatic stress disorder)  Treatment Plan:  Client Abilities/Strengths Taylor [name] is forthcoming and motivated for change.   Support System: Family and friends.   Client Treatment Preferences Outpatient Therapy.   Client Statement of Needs Taylor [name] would like to manage mood, process events, set boundaries for self and others, communicate positively and assertively, manage overall symptoms, increase frustration tolerance, improve self-talk, engage in self-care.    Treatment Level Weekly  Symptoms Depression: Sadness, lethargy, poor sleep, low motivation, irritability, negative self-talk, cognitive distortions (personalization).    (Status: maintained) Anxiety: Frequent worry, tension, difficulty managing anxiety, worrying about different things, feeling on edge.   (Status: maintained)  Goals:   Taylor [name] experiences symptoms of depression, anxiety, and PTSD.    Target Date: 09/27/22 Frequency: Weekly  Progress: 0 Modality: individual    Therapist will provide referrals for additional resources as appropriate.  Therapist will provide psycho-education regarding Taylor [name]'s diagnosis and corresponding treatment approaches and interventions. Licensed Clinical Social Worker, [place], LCSW will support the patient's ability to achieve the goals identified. will employ CBT, BA, Problem-solving, Solution Focused, Mindfulness,  coping skills, & other evidenced-based practices will be used to promote progress towards healthy functioning to help manage decrease  symptoms associated  with her diagnosis.   Reduce overall level, frequency, and intensity of the feelings of depression, anxiety and panic evidenced by decreased overall symptoms from 6 to 7 days/week to 0 to 1 days/week per client report for at least 3 consecutive months. Verbally express understanding of the relationship between feelings of depression, anxiety and their impact on thinking patterns and behaviors. Verbalize an understanding of the role that distorted thinking plays in creating fears, excessive worry, and ruminations.    Taylor [name] participated in the creation of the treatment plan)   Delight Ovens, LCSW

## 2022-08-28 ENCOUNTER — Ambulatory Visit: Payer: Medicare HMO | Admitting: Psychology

## 2022-08-28 DIAGNOSIS — F4310 Post-traumatic stress disorder, unspecified: Secondary | ICD-10-CM | POA: Diagnosis not present

## 2022-08-28 NOTE — Progress Notes (Signed)
[facility] Counselor/Therapist Progress Note  Patient ID: LOANY CARMACK, MRN: [id number]   Date: 08/28/2022  Time Spent: 1:07 pm - 1:48 pm : 41 Minutes  Treatment Type: Individual Therapy.   Mental Status Exam: Appearance:  Casual     Behavior: Appropriate  Motor: Normal  Speech/Language:  Clear and Coherent and Normal Rate  Affect: Flat  Mood: anxious and dysthymic  Thought process: normal  Thought content:   WNL  Sensory/Perceptual disturbances:   WNL  Orientation: oriented to person, place, time/date, and situation  Attention: Good  Concentration: Good  Memory: WNL  Fund of knowledge:  Good  Insight:   Good  Judgment:  Good  Impulse Control: Good   Risk Assessment: Danger to Self:  No Self-injurious Behavior: No Danger to Others: No Duty to Warn:no Physical Aggression / Violence:No  Access to Firearms a concern: No  Gang Involvement:No   Subjective:   Gae Dry participated from home, via video and consented to treatment and is aware of the limitations of teletherapy. Therapist participated from home office. Marai noted experiencing significant discomfort with sutures and this affecting her day-to-day functioning. She noted difficulty completing simple tasks and noted pain she experiences as a result. She noted this affecting her sleep. She noted her need to be patient during this time due to the slow healing process. She discussed being mindful of her pain level due to her pain affecting her mood, endorsing increased irritability. She noted recognizing improvement in her ability to move and use her hand post surgery. We processed the experience during the session. She noted feeling overwhelmed by this experience. We processed this during the session. Therapist validated and normalized Vora's feelings and experience and provided supportive therapy. A follow-up was scheduled for continued treatment.   Interventions: CBT & interpersonal  Diagnosis:   PTSD (post-traumatic stress disorder)  Treatment Plan:  Client Abilities/Strengths Mahreen is forthcoming and motivated for change.   Support System: Family and friends.   Client Treatment Preferences Outpatient Therapy.   Client Statement of Needs Anthonella would like to manage mood, process events, set boundaries for self and others, communicate positively and assertively, manage overall symptoms, increase frustration tolerance, improve self-talk, engage in self-care.    Treatment Level Weekly  Symptoms Depression: Sadness, lethargy, poor sleep, low motivation, irritability, negative self-talk, cognitive distortions (personalization).    (Status: maintained) Anxiety: Frequent worry, tension, difficulty managing anxiety, worrying about different things, feeling on edge.   (Status: maintained)  Goals:   Voncile experiences symptoms of depression, anxiety, and PTSD.    Target Date: 09/27/22 Frequency: Weekly  Progress: 0 Modality: individual    Therapist will provide referrals for additional resources as appropriate.  Therapist will provide psycho-education regarding Zeriah's diagnosis and corresponding treatment approaches and interventions. Licensed Clinical Social Worker, [place], LCSW will support the patient's ability to achieve the goals identified. will employ CBT, BA, Problem-solving, Solution Focused, Mindfulness,  coping skills, & other evidenced-based practices will be used to promote progress towards healthy functioning to help manage decrease symptoms associated with her diagnosis.   Reduce overall level, frequency, and intensity of the feelings of depression, anxiety and panic evidenced by decreased overall symptoms from 6 to 7 days/week to 0 to 1 days/week per client report for at least 3 consecutive months. Verbally express understanding of the relationship between feelings of depression, anxiety and their impact on thinking patterns and behaviors. Verbalize an  understanding of the role that distorted thinking plays in creating fears, excessive  worry, and ruminations.    Mickie Bail participated in the creation of the treatment plan)   Delight Ovens, LCSW

## 2022-09-11 ENCOUNTER — Encounter: Payer: Medicare HMO | Admitting: Psychology

## 2022-09-11 DIAGNOSIS — F4310 Post-traumatic stress disorder, unspecified: Secondary | ICD-10-CM

## 2022-09-11 NOTE — Progress Notes (Signed)
This encounter was created in error - please disregard.

## 2022-09-23 ENCOUNTER — Other Ambulatory Visit: Payer: Self-pay | Admitting: Obstetrics and Gynecology

## 2022-09-23 DIAGNOSIS — R928 Other abnormal and inconclusive findings on diagnostic imaging of breast: Secondary | ICD-10-CM

## 2022-12-03 ENCOUNTER — Other Ambulatory Visit: Payer: Self-pay | Admitting: Obstetrics and Gynecology

## 2022-12-03 ENCOUNTER — Inpatient Hospital Stay
Admission: RE | Admit: 2022-12-03 | Discharge: 2022-12-03 | Discharge status: Home / Self Care | Payer: 59 | Source: Ambulatory Visit | Attending: Obstetrics and Gynecology | Admitting: Obstetrics and Gynecology

## 2022-12-03 DIAGNOSIS — R928 Other abnormal and inconclusive findings on diagnostic imaging of breast: Secondary | ICD-10-CM

## 2022-12-03 DIAGNOSIS — R921 Mammographic calcification found on diagnostic imaging of breast: Secondary | ICD-10-CM

## 2023-05-24 ENCOUNTER — Emergency Department (HOSPITAL_COMMUNITY)
Admission: EM | Admit: 2023-05-24 | Discharge: 2023-05-24 | Disposition: A | Discharge status: Home / Self Care | Attending: Emergency Medicine | Admitting: Emergency Medicine

## 2023-05-24 ENCOUNTER — Emergency Department (EMERGENCY_DEPARTMENT_HOSPITAL)

## 2023-05-24 ENCOUNTER — Emergency Department (HOSPITAL_COMMUNITY)

## 2023-05-24 DIAGNOSIS — R1031 Right lower quadrant pain: Secondary | ICD-10-CM | POA: Diagnosis not present

## 2023-05-24 DIAGNOSIS — Z794 Long term (current) use of insulin: Secondary | ICD-10-CM | POA: Insufficient documentation

## 2023-05-24 DIAGNOSIS — M25552 Pain in left hip: Secondary | ICD-10-CM | POA: Diagnosis present

## 2023-05-24 DIAGNOSIS — I10 Essential (primary) hypertension: Secondary | ICD-10-CM | POA: Diagnosis not present

## 2023-05-24 DIAGNOSIS — M79662 Pain in left lower leg: Secondary | ICD-10-CM | POA: Diagnosis not present

## 2023-05-24 DIAGNOSIS — M7989 Other specified soft tissue disorders: Secondary | ICD-10-CM | POA: Diagnosis not present

## 2023-05-24 DIAGNOSIS — M79605 Pain in left leg: Secondary | ICD-10-CM

## 2023-05-24 DIAGNOSIS — Z79899 Other long term (current) drug therapy: Secondary | ICD-10-CM | POA: Diagnosis not present

## 2023-05-24 DIAGNOSIS — M549 Dorsalgia, unspecified: Secondary | ICD-10-CM | POA: Diagnosis not present

## 2023-05-24 LAB — CBC WITH DIFFERENTIAL/PLATELET
Abs Immature Granulocytes: 0.04 10*3/uL (ref 0.00–0.07)
Basophils Absolute: 0 10*3/uL (ref 0.0–0.1)
Basophils Relative: 0 %
Eosinophils Absolute: 0.1 10*3/uL (ref 0.0–0.5)
Eosinophils Relative: 1 %
HCT: 40.8 % (ref 36.0–46.0)
Hemoglobin: 12.7 g/dL (ref 12.0–15.0)
Immature Granulocytes: 0 %
Lymphocytes Relative: 32 %
Lymphs Abs: 2.9 10*3/uL (ref 0.7–4.0)
MCH: 27.1 pg (ref 26.0–34.0)
MCHC: 31.1 g/dL (ref 30.0–36.0)
MCV: 87.2 fL (ref 80.0–100.0)
Monocytes Absolute: 0.7 10*3/uL (ref 0.1–1.0)
Monocytes Relative: 7 %
Neutro Abs: 5.3 10*3/uL (ref 1.7–7.7)
Neutrophils Relative %: 60 %
Platelets: 255 10*3/uL (ref 150–400)
RBC: 4.68 MIL/uL (ref 3.87–5.11)
RDW: 13.8 % (ref 11.5–15.5)
WBC: 9 10*3/uL (ref 4.0–10.5)
nRBC: 0 % (ref 0.0–0.2)

## 2023-05-24 LAB — COMPREHENSIVE METABOLIC PANEL WITH GFR
ALT: 11 U/L (ref 0–44)
AST: 15 U/L (ref 15–41)
Albumin: 3.5 g/dL (ref 3.5–5.0)
Alkaline Phosphatase: 60 U/L (ref 38–126)
Anion gap: 7 (ref 5–15)
BUN: 10 mg/dL (ref 6–20)
CO2: 26 mmol/L (ref 22–32)
Calcium: 9 mg/dL (ref 8.9–10.3)
Chloride: 105 mmol/L (ref 98–111)
Creatinine, Ser: 0.84 mg/dL (ref 0.44–1.00)
GFR, Estimated: >60 mL/min (ref >60)
Glucose, Bld: 157 mg/dL — ABNORMAL HIGH (ref 70–99)
Potassium: 3.8 mmol/L (ref 3.5–5.1)
Sodium: 138 mmol/L (ref 135–145)
Total Bilirubin: 0.4 mg/dL (ref 0.0–1.2)
Total Protein: 6.5 g/dL (ref 6.5–8.1)

## 2023-05-24 LAB — TROPONIN I (HIGH SENSITIVITY): Troponin I (High Sensitivity): 2 ng/L (ref <18)

## 2023-05-24 LAB — CK: Total CK: 169 U/L (ref 38–234)

## 2023-05-24 MED ORDER — HYDROMORPHONE HCL 1 MG/ML IJ SOLN
1.0000 mg | Freq: Once | INTRAMUSCULAR | Status: AC
  Administered 2023-05-24: 1 mg via INTRAVENOUS
  Filled 2023-05-24: qty 1

## 2023-05-24 MED ORDER — IOHEXOL 300 MG/ML  SOLN
100.0000 mL | Freq: Once | INTRAMUSCULAR | Status: AC | PRN
  Administered 2023-05-24: 100 mL via INTRAVENOUS

## 2023-05-24 MED ORDER — ALBUTEROL SULFATE (2.5 MG/3ML) 0.083% IN NEBU
5.0000 mg | INHALATION_SOLUTION | Freq: Once | RESPIRATORY_TRACT | Status: AC
  Administered 2023-05-24: 5 mg via RESPIRATORY_TRACT
  Filled 2023-05-24: qty 6

## 2023-05-24 NOTE — ED Notes (Signed)
 Unsuccessful IV attempt x2.

## 2023-05-24 NOTE — Discharge Instructions (Signed)
 As we discussed, your ultrasound did not show any blood clot in your CT scan of your abdomen and back were normal.  You likely have chronic pain from your hip and your previous surgery.  I recommend that you call your pain management doctor tomorrow regarding prescription for pain medicine.  Since you have a pain contract I cannot prescribe any pain medicine for you  I also recommend you call Dr. Erma Hay office for follow-up  Return to ER if you have worse hip pain or back pain or leg pain

## 2023-05-24 NOTE — ED Triage Notes (Signed)
 Pt arrived reporting sent from [facility] for leg pain, left leg. Pt coming to R/O DVT. Denies recent travels, cp,shob or any other symptoms. States she has hx of high bp, complaint with meds.

## 2023-05-24 NOTE — ED Notes (Signed)
 [place] at bedside

## 2023-05-24 NOTE — Progress Notes (Signed)
 LLE venous duplex has been completed.  Preliminary results given to Dr. Delana Favors.    Results can be found under chart review under CV PROC. 05/24/2023 5:32 PM Marlowe Cinquemani RVT, RDMS
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
## 2023-06-24 ENCOUNTER — Emergency Department (HOSPITAL_COMMUNITY)
Admission: EM | Admit: 2023-06-24 | Discharge: 2023-06-24 | Disposition: A | Discharge status: Home / Self Care | Attending: Emergency Medicine | Admitting: Emergency Medicine

## 2023-06-24 ENCOUNTER — Other Ambulatory Visit: Payer: Self-pay

## 2023-06-24 ENCOUNTER — Emergency Department (HOSPITAL_COMMUNITY)

## 2023-06-24 DIAGNOSIS — W1830XA Fall on same level, unspecified, initial encounter: Secondary | ICD-10-CM | POA: Diagnosis not present

## 2023-06-24 DIAGNOSIS — W19XXXA Unspecified fall, initial encounter: Secondary | ICD-10-CM

## 2023-06-24 DIAGNOSIS — R519 Headache, unspecified: Secondary | ICD-10-CM | POA: Diagnosis not present

## 2023-06-24 DIAGNOSIS — M545 Low back pain, unspecified: Secondary | ICD-10-CM | POA: Diagnosis not present

## 2023-06-24 DIAGNOSIS — Z794 Long term (current) use of insulin: Secondary | ICD-10-CM | POA: Insufficient documentation

## 2023-06-24 DIAGNOSIS — M79652 Pain in left thigh: Secondary | ICD-10-CM | POA: Diagnosis not present

## 2023-06-24 DIAGNOSIS — M25562 Pain in left knee: Secondary | ICD-10-CM | POA: Insufficient documentation

## 2023-06-24 DIAGNOSIS — M25552 Pain in left hip: Secondary | ICD-10-CM | POA: Diagnosis not present

## 2023-06-24 MED ORDER — ACETAMINOPHEN 500 MG PO TABS
1000.0000 mg | ORAL_TABLET | Freq: Once | ORAL | Status: AC
  Administered 2023-06-24: 1000 mg via ORAL
  Filled 2023-06-24: qty 2
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
## 2023-06-24 NOTE — ED Triage Notes (Signed)
 Pt c/o left side shoulder and leg pain after a fall- also thinks she may have hit her head. Pt has been dealing with left leg pain for a while and today states he leg gave out.

## 2023-06-24 NOTE — Discharge Instructions (Addendum)
 Return to the emergency department if your symptoms worsen.  Follow-up with your primary care provider or pain management provider this week.  Continue current pain regimen as directed by your pain management provider, you can also use Tylenol  as needed for pain.

## 2023-11-20 NOTE — Progress Notes (Signed)
 SDOH reviewed   Patient was reminded to contact pcp and take bp medication/monitor bp

## 2024-01-07 NOTE — Progress Notes (Signed)
 Pt attended 11/07/2023 screening event with BP of 152/130. At event pt did not indicate any SDOH needs. Pt also noted that she is a former smoker and listed Medicaid as her insurance at the event.   Per chart review pt does have a PCP (Tammy Rachel Brought; [facility] [place]), insurance, and is a former smoker. Pt's last appt with PCP was 10/28/2023 and has an upcoming appt on 03/15/2024. Pt does not indicate any SDOH needs at this time.  No additional pt f/u to be scheduled at this time per health equity protocol.
